# Patient Record
Sex: Female | Born: 1990 | Race: Black or African American | Hispanic: No | Marital: Single | Smoking: Current every day smoker
Health system: Southern US, Community
[De-identification: ages and names within clinical notes are randomized; demographics above are authoritative.]

## PROBLEM LIST (undated history)

## (undated) ENCOUNTER — Inpatient Hospital Stay (HOSPITAL_COMMUNITY): Payer: Self-pay

## (undated) DIAGNOSIS — F32A Depression, unspecified: Secondary | ICD-10-CM

## (undated) DIAGNOSIS — Z789 Other specified health status: Secondary | ICD-10-CM

## (undated) DIAGNOSIS — F1994 Other psychoactive substance use, unspecified with psychoactive substance-induced mood disorder: Secondary | ICD-10-CM

## (undated) DIAGNOSIS — F419 Anxiety disorder, unspecified: Secondary | ICD-10-CM

## (undated) DIAGNOSIS — M419 Scoliosis, unspecified: Secondary | ICD-10-CM

## (undated) DIAGNOSIS — R011 Cardiac murmur, unspecified: Secondary | ICD-10-CM

## (undated) HISTORY — DX: Cardiac murmur, unspecified: R01.1

## (undated) HISTORY — DX: Anxiety disorder, unspecified: F41.9

## (undated) HISTORY — DX: Depression, unspecified: F32.A

## (undated) HISTORY — PX: NO PAST SURGERIES: SHX2092

## (undated) HISTORY — PX: EYE SURGERY: SHX253

---
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
## 2010-05-26 LAB — CBC
Hemoglobin: 11.7 g/dL — ABNORMAL LOW (ref 12.0–15.0)
MCH: 31 pg (ref 26.0–34.0)
MCV: 87.8 fL (ref 78.0–100.0)
RBC: 3.78 MIL/uL — ABNORMAL LOW (ref 3.87–5.11)

## 2010-05-26 LAB — URINALYSIS, ROUTINE W REFLEX MICROSCOPIC
Glucose, UA: NEGATIVE mg/dL
Hgb urine dipstick: NEGATIVE
Ketones, ur: 15 mg/dL — AB
Nitrite: NEGATIVE
Protein, ur: NEGATIVE mg/dL
Specific Gravity, Urine: 1.031 — ABNORMAL HIGH (ref 1.005–1.030)
Urobilinogen, UA: 1 mg/dL (ref 0.0–1.0)
pH: 5.5 (ref 5.0–8.0)

## 2010-05-26 LAB — SEDIMENTATION RATE: Sed Rate: 9 mm/h (ref 0–22)

## 2010-05-26 LAB — BASIC METABOLIC PANEL WITH GFR
BUN: 9 mg/dL (ref 6–23)
CO2: 25 meq/L (ref 19–32)
Calcium: 8.9 mg/dL (ref 8.4–10.5)
Chloride: 109 meq/L (ref 96–112)
Creatinine, Ser: 0.86 mg/dL (ref 0.4–1.2)
GFR calc non Af Amer: >60 mL/min
Glucose, Bld: 102 mg/dL — ABNORMAL HIGH (ref 70–99)
Potassium: 3.7 meq/L (ref 3.5–5.1)
Sodium: 138 meq/L (ref 135–145)

## 2010-05-26 LAB — POCT PREGNANCY, URINE: Preg Test, Ur: NEGATIVE

## 2010-05-26 LAB — DIFFERENTIAL
Eosinophils Absolute: 0.1 10*3/uL (ref 0.0–0.7)
Lymphs Abs: 4.5 10*3/uL — ABNORMAL HIGH (ref 0.7–4.0)
Monocytes Relative: 8 % (ref 3–12)
Neutrophils Relative %: 41 % — ABNORMAL LOW (ref 43–77)

## 2010-07-30 NOTE — Op Note (Signed)
[place]. [facility]  Patient:    Jennifer Campbell, Jennifer Campbell Visit Number: [id number] MRN: [id number]          Service Type: DSU Location: [facility] Attending Physician:  Shara Blazing Dictated by:   Pasty Spillers. Maple Hudson, M.D. Proc. Date: 11/17/00 Admit Date:  11/17/2000                             Operative Report  PREOPERATIVE DIAGNOSIS:  Dissociated vertical deviation, right eye.  POSTOPERATIVE DIAGNOSIS:  Dissociated vertical deviation, right eye.  PROCEDURE:  Superior rectus muscle recession, 12.0 mm OD (hemi-hangback), 7.0 mm OS.  SURGEON:  Pasty Spillers. Maple Hudson, M.D.  ANESTHESIA:  General (laryngeal mask).  COMPLICATIONS:  None.  DESCRIPTION OF PROCEDURE:  After routine preoperative evaluation, including informed consent from the parents, the patient was taken to the operating room, where she was identified by me.  General anesthesia was induced without difficulty after placement of appropriate monitors.  The patient was prepped and draped in standard sterile fashion.  A lid speculum was placed in the right eye.  Through a superotemporal fornix incision through conjunctiva and Tenons fascia, the right superior rectus muscle was engaged on a series of muscle hooks.  The check ligament along the superior surface of the muscle was carefully dissected by blunt and sharp dissection, taking care not to disrupt the superior oblique tendon.  The superior rectus tendon was secured with a double-armed 6-0 Vicryl suture, with a double locking bite at each border of the tendon.  The muscle was disinserted from the globe using Westcott scissors.  Each pole suture was passed in crossed-swords fashion into sclera at a measured distance of 6.0 mm posterior to the insertion.  The muscle was drawn up to this level, and the pole sutures were clamped with a hemostat at a measured distance of 6.0 mm above sclera.  The pole sutures were tied together at this location, and the  muscle was then allowed to hang back 6.0 mm, for a total recession of 12.0 mm.  Conjunctiva was closed with two interrupted 6-0 Vicryl sutures.  The lid speculum was transferred to the left eye, where the left superior rectus muscle was engaged and cleared as described for the right, but was recessed 7.0 mm directly, without hangback.  Conjunctiva was closed with a single interrupted 6-0 Vicryl suture.  Tobradex ointment was placed in each eye.  The patient was awakened without difficulty and taken to the recovery room in stable condition, having suffered no intraoperative or immediate postoperative complication. Dictated by:   Pasty Spillers. Maple Hudson, M.D. Attending Physician:  Shara Blazing DD:  11/17/00 TD:  11/18/00 Job: [id number]

## 2010-11-11 ENCOUNTER — Emergency Department (HOSPITAL_COMMUNITY)
Admission: EM | Admit: 2010-11-11 | Discharge: 2010-11-11 | Disposition: A | Discharge status: Home / Self Care | Payer: Self-pay | Attending: Emergency Medicine | Admitting: Emergency Medicine

## 2010-11-11 ENCOUNTER — Emergency Department (HOSPITAL_COMMUNITY): Payer: Self-pay

## 2010-11-11 DIAGNOSIS — M25579 Pain in unspecified ankle and joints of unspecified foot: Secondary | ICD-10-CM | POA: Insufficient documentation

## 2010-11-11 DIAGNOSIS — S8990XA Unspecified injury of unspecified lower leg, initial encounter: Secondary | ICD-10-CM | POA: Insufficient documentation

## 2010-11-11 DIAGNOSIS — M25473 Effusion, unspecified ankle: Secondary | ICD-10-CM | POA: Insufficient documentation

## 2010-11-11 DIAGNOSIS — M25476 Effusion, unspecified foot: Secondary | ICD-10-CM | POA: Insufficient documentation

## 2010-11-11 DIAGNOSIS — W010XXA Fall on same level from slipping, tripping and stumbling without subsequent striking against object, initial encounter: Secondary | ICD-10-CM | POA: Insufficient documentation

## 2010-11-11 DIAGNOSIS — S93409A Sprain of unspecified ligament of unspecified ankle, initial encounter: Secondary | ICD-10-CM | POA: Insufficient documentation

## 2011-06-08 ENCOUNTER — Encounter: Payer: Self-pay | Admitting: Family Medicine

## 2011-06-08 ENCOUNTER — Ambulatory Visit (INDEPENDENT_AMBULATORY_CARE_PROVIDER_SITE_OTHER): Payer: Self-pay | Admitting: Family Medicine

## 2011-06-08 VITALS — BP 103/69 | HR 86 | Temp 98.0°F | Ht 62.5 in | Wt 155.0 lb

## 2011-06-08 DIAGNOSIS — N938 Other specified abnormal uterine and vaginal bleeding: Secondary | ICD-10-CM

## 2011-06-08 DIAGNOSIS — N926 Irregular menstruation, unspecified: Secondary | ICD-10-CM

## 2011-06-08 DIAGNOSIS — G44229 Chronic tension-type headache, not intractable: Secondary | ICD-10-CM

## 2011-06-08 NOTE — Patient Instructions (Signed)
It was nice to meet you.  I thin your headaches are tension/stress headaches.  It is OK to take two extra strength tylenol or two ibuprofen for a headache (always take ibuprofen with food).    When you get the orange card, please call the office and let me know- I will make a referral to the eye doctor.   Please come in and see me around your 21st birthday and we will do your full physical and pap smear.  You can call anytime for a visit if you want to see me before than.

## 2011-06-09 DIAGNOSIS — G44229 Chronic tension-type headache, not intractable: Secondary | ICD-10-CM | POA: Insufficient documentation

## 2011-06-09 DIAGNOSIS — N938 Other specified abnormal uterine and vaginal bleeding: Secondary | ICD-10-CM | POA: Insufficient documentation

## 2011-06-09 NOTE — Assessment & Plan Note (Addendum)
Feel these are stress related, and reassured patient.  Discussed some stress relieving techniques, and advised it is OK to take occasional medications for headaches.

## 2011-06-09 NOTE — Progress Notes (Signed)
  Subjective:    Patient ID: Jennifer Campbell, female    DOB: 1990-12-23, 20 y.o.   MRN: [id number]  HPI  Jennifer Campbell comes in to establish care. She has not seen a doctor since she was in highschool.  She says she is not currently taking any medications, and that she has no medical problems.   She complains of chronic headaches.  They happen a few times a week, and are dull and throughout her head.  She does not have associated nausea/voming, aura or vision changes.  She is going to school and working and does associate these headaches with her stress.  She does not like to take medication and has only tried one tylenol for the headaches.   Irregular periods- Patient got a depo shot last May for the first time.  She says before that she had had regular periods and had never been on birth control since then.  She says the Depo made her spot a lot, and gave her mood swings, so she did not get the next dose in August.  She says since then her menstrual cycle has been a roller coaster.  She has had weeks where she has a regular period, stops for a few days, and begins bleeding again. She denies having any blood clots, light headedness, or fatigue. She has some mild cramping but not severe pain with her cycle.  She says the past few months her periods have been closer to how they used to be.   Review of Systems Pertinent items in HPI.     Objective:   Physical Exam BP 103/69  Pulse 86  Temp(Src) 98 F (36.7 C) (Oral)  Ht 5' 2.5" (1.588 m)  Wt 155 lb (70.308 kg)  BMI 27.90 kg/m2 General appearance: alert, cooperative and no distress Head: Normocephalic, without obvious abnormality, atraumatic Eyes: conjunctivae/corneas clear. PERRL, EOM's intact. Fundi benign. Nose: Nares normal. Septum midline. Mucosa normal. No drainage or sinus tenderness. CV: RRR Pulm: CTAB Skin: normal color and turgor Pulses: 2+ and symmetric.  Extremities: No edema.  Neuro: CN in tact, strength in upper and lower  extremities full and symmetric, DTR's 2+.       Assessment & Plan:

## 2011-06-09 NOTE — Assessment & Plan Note (Signed)
I feel this may have all been due to the depo shot, and advised patient wait a few more months to see if her periods regulate, as she is not currently interested in OCP's. I advised that she come back if the problem does not continue to improve, as at that point I think the pill would help.

## 2012-03-01 ENCOUNTER — Emergency Department (HOSPITAL_COMMUNITY)
Admission: EM | Admit: 2012-03-01 | Discharge: 2012-03-01 | Disposition: A | Discharge status: Home / Self Care | Payer: Self-pay | Attending: Emergency Medicine | Admitting: Emergency Medicine

## 2012-03-01 ENCOUNTER — Encounter (HOSPITAL_COMMUNITY): Payer: Self-pay | Admitting: *Deleted

## 2012-03-01 DIAGNOSIS — F17200 Nicotine dependence, unspecified, uncomplicated: Secondary | ICD-10-CM | POA: Insufficient documentation

## 2012-03-01 DIAGNOSIS — IMO0002 Reserved for concepts with insufficient information to code with codable children: Secondary | ICD-10-CM | POA: Insufficient documentation

## 2012-03-01 DIAGNOSIS — Z791 Long term (current) use of non-steroidal anti-inflammatories (NSAID): Secondary | ICD-10-CM | POA: Insufficient documentation

## 2012-03-01 DIAGNOSIS — L02419 Cutaneous abscess of limb, unspecified: Secondary | ICD-10-CM

## 2012-03-01 MED ORDER — IBUPROFEN 800 MG PO TABS
800.0000 mg | ORAL_TABLET | Freq: Once | ORAL | Status: AC
  Administered 2012-03-01: 800 mg via ORAL
  Filled 2012-03-01: qty 1

## 2012-03-01 NOTE — ED Notes (Signed)
States has a lump in left armpit. Has been there a while but painful for 2 weeks.

## 2012-03-01 NOTE — ED Provider Notes (Signed)
History     CSN: [id number]  Arrival date & time 03/01/12  0711   First MD Initiated Contact with Patient 03/01/12 0755      Chief Complaint  Patient presents with  . Abscess    (Consider location/radiation/quality/duration/timing/severity/associated sxs/prior treatment) HPI    Patient is up to the emergency department for evaluation of an abscess to her left axilla. She denies having this problem in the past. She says that there has been a mass there for 2 years but in the past 2 weeks it became red and painful. The patient was at work and left early because it was bothering her so much. She denies having fevers, nausea, vomiting, diarrhea or weakness. nad vss  History reviewed. No pertinent past medical history.  History reviewed. No pertinent past surgical history.  Family History  Problem Relation Age of Onset  . Depression Mother   . Asthma Mother   . Asthma Maternal Aunt   . Heart disease Maternal Grandfather   . Depression Paternal Grandmother     History  Substance Use Topics  . Smoking status: Current Some Day Smoker  . Smokeless tobacco: Not on file  . Alcohol Use: No    OB History    Grav Para Term Preterm Abortions TAB SAB Ect Mult Living                  Review of Systems  Review of Systems  Gen: no weight loss, fevers, chills, night sweats  Eyes: no discharge or drainage, no occular pain or visual changes  Abd: no abdominal pain, nausea, vomiting  GU: no dysuria or gross hematuria  MSK:  Abscess to skin Neuro: no headache, no focal neurologic deficits  Skin: no abnormalities Psyche: negative.   Allergies  Review of patient's allergies indicates no known allergies.  Home Medications   Current Outpatient Rx  Name  Route  Sig  Dispense  Refill  . ACETAMINOPHEN 325 MG PO TABS   Oral   Take 650 mg by mouth every 6 (six) hours as needed. Headache or fever           BP 148/82  Pulse 97  Temp 98.3 F (36.8 C) (Oral)  Resp 20  SpO2  100%  LMP 02/14/2012  Physical Exam  Nursing note and vitals reviewed. Constitutional: She appears well-developed and well-nourished. No distress.  HENT:  Head: Normocephalic and atraumatic.  Eyes: Pupils are equal, round, and reactive to light.  Neck: Normal range of motion. Neck supple.  Cardiovascular: Normal rate and regular rhythm.   Pulmonary/Chest: Effort normal.  Abdominal: Soft.  Neurological: She is alert.  Skin: Skin is warm and dry.       ED Course  INCISION AND DRAINAGE Date/Time: 03/01/2012 9:21 AM Performed by: Dorthula Matas Authorized by: Dorthula Matas Consent: Verbal consent obtained. Risks and benefits: risks, benefits and alternatives were discussed Consent given by: patient Type: abscess Location: left axilla. Anesthesia: local infiltration Local anesthetic: lidocaine 2% with epinephrine Patient sedated: no Scalpel size: 11 Needle gauge: 20 Incision type: single straight Complexity: complex Drainage: purulent Drainage amount: moderate Wound treatment: wound left open   (including critical care time)  Labs Reviewed - No data to display No results found.   1. Abscess of axilla       MDM  No need for abx.   Pt has been advised of the symptoms that warrant their return to the ED. Patient has voiced understanding and has agreed to follow-up  with the PCP or specialist.         Dorthula Matas, PA 03/01/12 [phone]

## 2012-03-02 NOTE — ED Provider Notes (Signed)
Medical screening examination/treatment/procedure(s) were performed by non-physician practitioner and as supervising physician I was immediately available for consultation/collaboration.   Joya Gaskins, MD 03/02/12 [phone]

## 2012-04-17 ENCOUNTER — Emergency Department (HOSPITAL_COMMUNITY)
Admission: EM | Admit: 2012-04-17 | Discharge: 2012-04-17 | Disposition: A | Discharge status: Home / Self Care | Payer: Self-pay | Attending: Emergency Medicine | Admitting: Emergency Medicine

## 2012-04-17 ENCOUNTER — Encounter (HOSPITAL_COMMUNITY): Payer: Self-pay | Admitting: *Deleted

## 2012-04-17 DIAGNOSIS — F17200 Nicotine dependence, unspecified, uncomplicated: Secondary | ICD-10-CM | POA: Insufficient documentation

## 2012-04-17 DIAGNOSIS — R3915 Urgency of urination: Secondary | ICD-10-CM | POA: Insufficient documentation

## 2012-04-17 DIAGNOSIS — N390 Urinary tract infection, site not specified: Secondary | ICD-10-CM | POA: Insufficient documentation

## 2012-04-17 DIAGNOSIS — Z3202 Encounter for pregnancy test, result negative: Secondary | ICD-10-CM | POA: Insufficient documentation

## 2012-04-17 DIAGNOSIS — R34 Anuria and oliguria: Secondary | ICD-10-CM | POA: Insufficient documentation

## 2012-04-17 LAB — URINALYSIS, ROUTINE W REFLEX MICROSCOPIC
Nitrite: NEGATIVE
Specific Gravity, Urine: 1.028 (ref 1.005–1.030)
Urobilinogen, UA: 1 mg/dL (ref 0.0–1.0)

## 2012-04-17 LAB — POCT PREGNANCY, URINE: Preg Test, Ur: NEGATIVE

## 2012-04-17 LAB — URINE MICROSCOPIC-ADD ON

## 2012-04-17 MED ORDER — NITROFURANTOIN MONOHYD MACRO 100 MG PO CAPS
100.0000 mg | ORAL_CAPSULE | ORAL | Status: DC
  Filled 2012-04-17: qty 1

## 2012-04-17 MED ORDER — CEPHALEXIN 250 MG PO CAPS
500.0000 mg | ORAL_CAPSULE | Freq: Once | ORAL | Status: AC
  Administered 2012-04-17: 500 mg via ORAL
  Filled 2012-04-17: qty 2

## 2012-04-17 MED ORDER — PHENAZOPYRIDINE HCL 100 MG PO TABS
200.0000 mg | ORAL_TABLET | Freq: Once | ORAL | Status: AC
  Administered 2012-04-17: 200 mg via ORAL
  Filled 2012-04-17: qty 2

## 2012-04-17 MED ORDER — CEPHALEXIN 500 MG PO CAPS: 500.0000 mg | ORAL_CAPSULE | Freq: Two times a day (BID) | ORAL | Status: DC

## 2012-04-17 MED ORDER — PHENAZOPYRIDINE HCL 200 MG PO TABS: 200.0000 mg | ORAL_TABLET | Freq: Three times a day (TID) | ORAL | Status: DC

## 2012-04-17 NOTE — ED Provider Notes (Signed)
History  Scribed for Jaynie Crumble, PA-C/ Joya Gaskins, MD, the patient was seen in room TR09C/TR09C. This chart was scribed by Candelaria Stagers. The patient's care started at 6:07 PM   CSN: [id number]  Arrival date & time 04/17/12  1722   First MD Initiated Contact with Patient 04/17/12 1744      Chief Complaint  Patient presents with  . Urinary Tract Infection     The history is provided by the patient. No language interpreter was used.   Jennifer Campbell is a 22 y.o. female who presents to the Emergency Department complaining of dysuria and decreased urination that started today.  Pt denies vaginal discharge or bleeding, fever, chills, abdominal pain, or back pain.  She is sexually active with one partner.  Nothing seems to make the sx better or worse.   Did not take any medication. No hx of the same.  History reviewed. No pertinent past medical history.  History reviewed. No pertinent past surgical history.  Family History  Problem Relation Age of Onset  . Depression Mother   . Asthma Mother   . Asthma Maternal Aunt   . Heart disease Maternal Grandfather   . Depression Paternal Grandmother     History  Substance Use Topics  . Smoking status: Current Some Day Smoker -- 0.1 packs/day    Types: Cigarettes  . Smokeless tobacco: Not on file  . Alcohol Use: No    OB History    Grav Para Term Preterm Abortions TAB SAB Ect Mult Living                  Review of Systems  Constitutional: Negative for fever and chills.  Respiratory: Negative for shortness of breath.   Gastrointestinal: Negative for nausea and vomiting.  Genitourinary: Positive for dysuria, urgency and decreased urine volume. Negative for vaginal bleeding and vaginal discharge.  Musculoskeletal: Negative for back pain.  Skin: Negative for rash.  Neurological: Negative for weakness.  All other systems reviewed and are negative.    Allergies  Review of patient's allergies indicates no  known allergies.  Home Medications  No current outpatient prescriptions on file.  BP 147/82  Pulse 66  Temp 98.4 F (36.9 C) (Oral)  Resp 16  SpO2 100%  LMP 03/03/2012  Physical Exam  Nursing note and vitals reviewed. Constitutional: She is oriented to person, place, and time. She appears well-developed and well-nourished. No distress.  HENT:  Head: Normocephalic and atraumatic.  Eyes: EOM are normal.  Neck: Neck supple. No tracheal deviation present.  Cardiovascular: Normal rate and regular rhythm.   Pulmonary/Chest: Effort normal. No respiratory distress. She has no wheezes. She has no rales.  Abdominal: Soft. Bowel sounds are normal. There is no tenderness.  Musculoskeletal: Normal range of motion.  Neurological: She is alert and oriented to person, place, and time.  Skin: Skin is warm and dry.  Psychiatric: She has a normal mood and affect. Her behavior is normal.    ED Course  Procedures   DIAGNOSTIC STUDIES: Oxygen Saturation is 100% on room air, normal by my interpretation.    COORDINATION OF CARE:  5:40 PM Ordered: Urinalysis, Routine w reflex microscopic; POCT Pregnancy, Urine  6:39 PM Will discharge pt with antibiotics.  Pt understands and agrees.  Discharged with cephALEXin (KEFLEX) capsule 500 mg; phenazopyridine (PYRIDIUM) tablet 200 mg Labs Reviewed  URINALYSIS, ROUTINE W REFLEX MICROSCOPIC - Abnormal; Notable for the following:    APPearance HAZY (*)     Hgb  urine dipstick LARGE (*)     Bilirubin Urine SMALL (*)     Ketones, ur 40 (*)     Protein, ur 100 (*)     Leukocytes, UA MODERATE (*)     All other components within normal limits  URINE MICROSCOPIC-ADD ON - Abnormal; Notable for the following:    Squamous Epithelial / LPF MANY (*)     All other components within normal limits  POCT PREGNANCY, URINE   No results found.   1. UTI (lower urinary tract infection)       MDM  PT with urinary symptoms. Afebrile. Non toxic. UA infected but  contaminated. Cultures sent. Due to symptoms, will treat. Started on keflex. Pyridium for symptoms. Denies vaginal symptoms. Does not want pelvic exam. Will follow up with pcp.     I personally performed the services described in this documentation, which was scribed in my presence. The recorded information has been reviewed and is accurate.      Lottie Mussel, PA 04/18/12 [phone]

## 2012-04-17 NOTE — ED Notes (Signed)
Pt c/o difficulty urinating and pain on urination since last night.  Denies flank pain, abd pain or nausea.  States she "peed on myself in my sleep".  Denies vaginal discharge.

## 2012-04-17 NOTE — ED Notes (Signed)
Pt reports dysuria starting this am. States that she woke up this am and had urinated on herself last night. Reports urinary urgency, frequency, and pain on urination. Denies nausea, vomiting, or abdominal pain.

## 2012-04-18 LAB — URINE CULTURE: Colony Count: NO GROWTH

## 2012-04-19 NOTE — ED Provider Notes (Signed)
Medical screening examination/treatment/procedure(s) were performed by non-physician practitioner and as supervising physician I was immediately available for consultation/collaboration.   Kristy Catoe W Makinsley Schiavi, MD 04/19/12 2222 

## 2012-07-20 ENCOUNTER — Emergency Department (HOSPITAL_COMMUNITY)
Admission: EM | Admit: 2012-07-20 | Discharge: 2012-07-20 | Disposition: A | Discharge status: Home / Self Care | Payer: No Typology Code available for payment source | Attending: Emergency Medicine | Admitting: Emergency Medicine

## 2012-07-20 ENCOUNTER — Encounter (HOSPITAL_COMMUNITY): Payer: Self-pay | Admitting: Emergency Medicine

## 2012-07-20 DIAGNOSIS — Y9389 Activity, other specified: Secondary | ICD-10-CM | POA: Insufficient documentation

## 2012-07-20 DIAGNOSIS — S6990XA Unspecified injury of unspecified wrist, hand and finger(s), initial encounter: Secondary | ICD-10-CM | POA: Insufficient documentation

## 2012-07-20 DIAGNOSIS — Y92410 Unspecified street and highway as the place of occurrence of the external cause: Secondary | ICD-10-CM | POA: Insufficient documentation

## 2012-07-20 DIAGNOSIS — S59919A Unspecified injury of unspecified forearm, initial encounter: Secondary | ICD-10-CM | POA: Insufficient documentation

## 2012-07-20 DIAGNOSIS — M7918 Myalgia, other site: Secondary | ICD-10-CM

## 2012-07-20 DIAGNOSIS — S59909A Unspecified injury of unspecified elbow, initial encounter: Secondary | ICD-10-CM | POA: Insufficient documentation

## 2012-07-20 DIAGNOSIS — S8990XA Unspecified injury of unspecified lower leg, initial encounter: Secondary | ICD-10-CM | POA: Insufficient documentation

## 2012-07-20 DIAGNOSIS — S99919A Unspecified injury of unspecified ankle, initial encounter: Secondary | ICD-10-CM | POA: Insufficient documentation

## 2012-07-20 DIAGNOSIS — F17200 Nicotine dependence, unspecified, uncomplicated: Secondary | ICD-10-CM | POA: Insufficient documentation

## 2012-07-20 MED ORDER — IBUPROFEN 800 MG PO TABS
800.0000 mg | ORAL_TABLET | Freq: Once | ORAL | Status: AC
  Administered 2012-07-20: 800 mg via ORAL
  Filled 2012-07-20: qty 1

## 2012-07-20 MED ORDER — HYDROCODONE-ACETAMINOPHEN 5-325 MG PO TABS: 1.0000 | ORAL_TABLET | Freq: Four times a day (QID) | ORAL | Status: DC | PRN

## 2012-07-20 MED ORDER — DIAZEPAM 5 MG PO TABS: ORAL_TABLET | ORAL | Status: DC
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
## 2012-07-20 NOTE — ED Notes (Signed)
Pt asked to change to gown.  RN at the bedside

## 2012-07-21 NOTE — ED Provider Notes (Signed)
Medical screening examination/treatment/procedure(s) were performed by non-physician practitioner and as supervising physician I was immediately available for consultation/collaboration.   Carleene Cooper III, MD 07/21/12 1228

## 2013-02-12 ENCOUNTER — Encounter (HOSPITAL_COMMUNITY): Payer: Self-pay | Admitting: Emergency Medicine

## 2013-02-12 ENCOUNTER — Emergency Department (HOSPITAL_COMMUNITY)
Admission: EM | Admit: 2013-02-12 | Discharge: 2013-02-12 | Disposition: A | Discharge status: Home / Self Care | Payer: No Typology Code available for payment source | Attending: Emergency Medicine | Admitting: Emergency Medicine

## 2013-02-12 DIAGNOSIS — R21 Rash and other nonspecific skin eruption: Secondary | ICD-10-CM | POA: Insufficient documentation

## 2013-02-12 DIAGNOSIS — Z79899 Other long term (current) drug therapy: Secondary | ICD-10-CM | POA: Insufficient documentation

## 2013-02-12 DIAGNOSIS — F17200 Nicotine dependence, unspecified, uncomplicated: Secondary | ICD-10-CM | POA: Insufficient documentation

## 2013-02-12 MED ORDER — PREDNISONE 20 MG PO TABS: 60.0000 mg | ORAL_TABLET | Freq: Every day | ORAL | Status: DC

## 2013-02-12 NOTE — ED Provider Notes (Signed)
CSN: [id number]     Arrival date & time 02/12/13  1927 History  This chart was scribed for non-physician practitioner, Santiago Glad, PA-C,working with Glynn Octave, MD, by Karle Plumber, ED Scribe.  This patient was seen in room TR10C/TR10C and the patient's care was started at 9:06 PM.  Chief Complaint  Patient presents with  . Rash   The history is provided by the patient. No language interpreter was used.   HPI Comments:  Jennifer Campbell is a 22 y.o. female who presents to the Emergency Department complaining of a new itchy rash that started yesterday morning. She reports the rash on her face, neck, back of neck, right hand, and left leg. Pt states she took a Benadryl yesterday with no relief. She states she has applied Triamcinolone Cream PTA with no relief. She states her room mate or anybody else around her has the rash. Pt reports using a new soap the night before waking up with the rash. Pt denies fever, chills, nausea, vomiting, or SOB. Pt expresses concern of DM secondary to family history and requests a referral for a PCP.  History reviewed. No pertinent past medical history. History reviewed. No pertinent past surgical history. Family History  Problem Relation Age of Onset  . Depression Mother   . Asthma Mother   . Asthma Maternal Aunt   . Heart disease Maternal Grandfather   . Depression Paternal Grandmother    History  Substance Use Topics  . Smoking status: Current Some Day Smoker -- 0.15 packs/day    Types: Cigarettes  . Smokeless tobacco: Not on file  . Alcohol Use: No   OB History   Grav Para Term Preterm Abortions TAB SAB Ect Mult Living                 Review of Systems  Constitutional: Negative for fever, chills and unexpected weight change.  Respiratory: Negative for shortness of breath.   Gastrointestinal: Negative for nausea and vomiting.  All other systems reviewed and are negative.    Allergies  Review of patient's allergies indicates  no known allergies.  Home Medications   Current Outpatient Rx  Name  Route  Sig  Dispense  Refill  . diazepam (VALIUM) 5 MG tablet      Take one pill at night time for sleep as a muscle relaxant.   6 tablet   0   . HYDROcodone-acetaminophen (NORCO/VICODIN) 5-325 MG per tablet   Oral   Take 1 tablet by mouth every 6 (six) hours as needed for pain.   6 tablet   0    Triage Vitals: BP 138/71  Pulse 58  Temp(Src) 98.1 F (36.7 C) (Oral)  Resp 14  Ht 5\' 2"  (1.575 m)  Wt 125 lb (56.7 kg)  BMI 22.86 kg/m2  SpO2 100%  LMP 01/28/2013 Physical Exam  Nursing note and vitals reviewed. Constitutional: She is oriented to person, place, and time. She appears well-developed and well-nourished.  HENT:  Head: Normocephalic and atraumatic.  Mouth/Throat: Oropharynx is clear and moist.  Neck: Neck supple.  Pulmonary/Chest: Effort normal.  Neurological: She is alert and oriented to person, place, and time. No cranial nerve deficit.  Skin: Skin is dry. Rash noted. There is erythema.  Erythematous base with centrally located papule on distal left forearm of posterior aspect. No drainage noted.  2 papules on medial aspect of left lower leg. No drainage noted. Several periorbital small papules on face. No drainage noted.   Psychiatric: She has  a normal mood and affect. Her behavior is normal.    ED Course  Procedures (including critical care time) DIAGNOSTIC STUDIES: Oxygen Saturation is 100% on RA, normal by my interpretation.   COORDINATION OF CARE: 9:14 PM- Advised pt not to use the new soap anymore. Will prescribe Prednisone. Advised pt to continue taking the Benadryl and to try OTC Pepcid. Informed pt that she can apply Hydrocortisone Cream, but use caution in applying it to her face. Will give pt referral for PCP. Pt verbalizes understanding and agrees to plan.  Medications - No data to display  Labs Review Labs Reviewed - No data to display Imaging Review No results  found.  EKG Interpretation   None       MDM  No diagnosis found. Patient presenting with a rash that developed after using a new soap.  Suspect contact dermatitis.  No swelling of the tongue, lips, or throat.  Patient instructed to stop using the soap and to take Benadryl for itching.  Patient stable for discharge.  I personally performed the services described in this documentation, which was scribed in my presence. The recorded information has been reviewed and is accurate.    Santiago Glad, PA-C 02/14/13 1206

## 2013-02-12 NOTE — ED Notes (Signed)
Pt. reports itchy facial rash onset yesterday unrelieved by OTC Benadryl , respirations unlabored / airway intact .

## 2013-02-14 NOTE — ED Provider Notes (Signed)
Medical screening examination/treatment/procedure(s) were performed by non-physician practitioner and as supervising physician I was immediately available for consultation/collaboration.  EKG Interpretation   None         Glynn Octave, MD 02/14/13 [phone]

## 2013-02-22 ENCOUNTER — Encounter: Payer: Self-pay | Admitting: Family Medicine

## 2013-05-24 ENCOUNTER — Encounter (HOSPITAL_COMMUNITY): Payer: Self-pay | Admitting: Emergency Medicine

## 2013-05-24 ENCOUNTER — Emergency Department (HOSPITAL_COMMUNITY)
Admission: EM | Admit: 2013-05-24 | Discharge: 2013-05-24 | Disposition: A | Discharge status: Home / Self Care | Payer: Self-pay | Attending: Emergency Medicine | Admitting: Emergency Medicine

## 2013-05-24 ENCOUNTER — Emergency Department (HOSPITAL_COMMUNITY): Payer: Self-pay

## 2013-05-24 DIAGNOSIS — F17200 Nicotine dependence, unspecified, uncomplicated: Secondary | ICD-10-CM | POA: Insufficient documentation

## 2013-05-24 DIAGNOSIS — M25512 Pain in left shoulder: Secondary | ICD-10-CM

## 2013-05-24 DIAGNOSIS — Z87828 Personal history of other (healed) physical injury and trauma: Secondary | ICD-10-CM | POA: Insufficient documentation

## 2013-05-24 DIAGNOSIS — IMO0002 Reserved for concepts with insufficient information to code with codable children: Secondary | ICD-10-CM | POA: Insufficient documentation

## 2013-05-24 DIAGNOSIS — M25519 Pain in unspecified shoulder: Secondary | ICD-10-CM | POA: Insufficient documentation

## 2013-05-24 MED ORDER — NAPROXEN 500 MG PO TABS: 500.0000 mg | ORAL_TABLET | Freq: Two times a day (BID) | ORAL | Status: DC

## 2013-05-24 NOTE — Discharge Instructions (Signed)
Your imaging was negative for fracture or dislocation. You will need to see a shoulder specialist for your shoulder. Shoulder Pain The shoulder is the joint that connects your arms to your body. The bones that form the shoulder joint include the upper arm bone (humerus), the shoulder blade (scapula), and the collarbone (clavicle). The top of the humerus is shaped like a ball and fits into a rather flat socket on the scapula (glenoid cavity). A combination of muscles and strong, fibrous tissues that connect muscles to bones (tendons) support your shoulder joint and hold the ball in the socket. Small, fluid-filled sacs (bursae) are located in different areas of the joint. They act as cushions between the bones and the overlying soft tissues and help reduce friction between the gliding tendons and the bone as you move your arm. Your shoulder joint allows a wide range of motion in your arm. This range of motion allows you to do things like scratch your back or throw a ball. However, this range of motion also makes your shoulder more prone to pain from overuse and injury. Causes of shoulder pain can originate from both injury and overuse and usually can be grouped in the following four categories:  Redness, swelling, and pain (inflammation) of the tendon (tendinitis) or the bursae (bursitis).  Instability, such as a dislocation of the joint.  Inflammation of the joint (arthritis).  Broken bone (fracture). HOME CARE INSTRUCTIONS   Apply ice to the sore area.  Put ice in a plastic bag.  Place a towel between your skin and the bag.  Leave the ice on for 15-20 minutes, 03-04 times per day for the first 2 days.  Stop using cold packs if they do not help with the pain.  If you have a shoulder sling or immobilizer, wear it as long as your caregiver instructs. Only remove it to shower or bathe. Move your arm as little as possible, but keep your hand moving to prevent swelling.  Squeeze a soft ball or  foam pad as much as possible to help prevent swelling.  Only take over-the-counter or prescription medicines for pain, discomfort, or fever as directed by your caregiver. SEEK MEDICAL CARE IF:   Your shoulder pain increases, or new pain develops in your arm, hand, or fingers.  Your hand or fingers become cold and numb.  Your pain is not relieved with medicines. SEEK IMMEDIATE MEDICAL CARE IF:   Your arm, hand, or fingers are numb or tingling.  Your arm, hand, or fingers are significantly swollen or turn white or blue. MAKE SURE YOU:   Understand these instructions.  Will watch your condition.  Will get help right away if you are not doing well or get worse. Document Released: 12/08/2004 Document Revised: 11/23/2011 Document Reviewed: 02/12/2011 [facility] Patient Information 2014 [place], [place].   Naproxen and naproxen sodium oral immediate-release tablets What is this medicine? NAPROXEN (na PROX en) is a non-steroidal anti-inflammatory drug (NSAID). It is used to reduce swelling and to treat pain. This medicine may be used for dental pain, headache, or painful monthly periods. It is also used for painful joint and muscular problems such as arthritis, tendinitis, bursitis, and gout. This medicine may be used for other purposes; ask your health care provider or pharmacist if you have questions. COMMON BRAND NAME(S): Aflaxen, Aleve Arthritis, Aleve, All Day Relief, Anaprox DS, Anaprox, Naprosyn What should I tell my health care provider before I take this medicine? They need to know if you have any of  these conditions: -asthma -cigarette smoker -drink more than 3 alcohol containing drinks a day -heart disease or circulation problems such as heart failure or leg edema (fluid retention) -high blood pressure -kidney disease -liver disease -stomach bleeding or ulcers -an unusual or allergic reaction to naproxen, aspirin, other NSAIDs, other medicines, foods, dyes, or  preservatives -pregnant or trying to get pregnant -breast-feeding How should I use this medicine? Take this medicine by mouth with a glass of water. Follow the directions on the prescription label. Take it with food if your stomach gets upset. Try to not lie down for at least 10 minutes after you take it. Take your medicine at regular intervals. Do not take your medicine more often than directed. Long-term, continuous use may increase the risk of heart attack or stroke. A special MedGuide will be given to you by the pharmacist with each prescription and refill. Be sure to read this information carefully each time. Talk to your pediatrician regarding the use of this medicine in children. Special care may be needed. Overdosage: If you think you have taken too much of this medicine contact a poison control center or emergency room at once. NOTE: This medicine is only for you. Do not share this medicine with others. What if I miss a dose? If you miss a dose, take it as soon as you can. If it is almost time for your next dose, take only that dose. Do not take double or extra doses. What may interact with this medicine? -alcohol -aspirin -cidofovir -diuretics -lithium -methotrexate -other drugs for inflammation like ketorolac or prednisone -pemetrexed -probenecid -warfarin This list may not describe all possible interactions. Give your health care provider a list of all the medicines, herbs, non-prescription drugs, or dietary supplements you use. Also tell them if you smoke, drink alcohol, or use illegal drugs. Some items may interact with your medicine. What should I watch for while using this medicine? Tell your doctor or health care professional if your pain does not get better. Talk to your doctor before taking another medicine for pain. Do not treat yourself. This medicine does not prevent heart attack or stroke. In fact, this medicine may increase the chance of a heart attack or stroke. The  chance may increase with longer use of this medicine and in people who have heart disease. If you take aspirin to prevent heart attack or stroke, talk with your doctor or health care professional. Do not take other medicines that contain aspirin, ibuprofen, or naproxen with this medicine. Side effects such as stomach upset, nausea, or ulcers may be more likely to occur. Many medicines available without a prescription should not be taken with this medicine. This medicine can cause ulcers and bleeding in the stomach and intestines at any time during treatment. Do not smoke cigarettes or drink alcohol. These increase irritation to your stomach and can make it more susceptible to damage from this medicine. Ulcers and bleeding can happen without warning symptoms and can cause death. You may get drowsy or dizzy. Do not drive, use machinery, or do anything that needs mental alertness until you know how this medicine affects you. Do not stand or sit up quickly, especially if you are an older patient. This reduces the risk of dizzy or fainting spells. This medicine can cause you to bleed more easily. Try to avoid damage to your teeth and gums when you brush or floss your teeth. What side effects may I notice from receiving this medicine? Side effects that you  should report to your doctor or health care professional as soon as possible: -black or bloody stools, blood in the urine or vomit -blurred vision -chest pain -difficulty breathing or wheezing -nausea or vomiting -severe stomach pain -skin rash, skin redness, blistering or peeling skin, hives, or itching -slurred speech or weakness on one side of the body -swelling of eyelids, throat, lips -unexplained weight gain or swelling -unusually weak or tired -yellowing of eyes or skin Side effects that usually do not require medical attention (report to your doctor or health care professional if they continue or are  bothersome): -constipation -headache -heartburn This list may not describe all possible side effects. Call your doctor for medical advice about side effects. You may report side effects to FDA at 1-800-FDA-1088. Where should I keep my medicine? Keep out of the reach of children. Store at room temperature between 15 and 30 degrees C (59 and 86 degrees F). Keep container tightly closed. Throw away any unused medicine after the expiration date. NOTE: This sheet is a summary. It may not cover all possible information. If you have questions about this medicine, talk to your doctor, pharmacist, or health care provider.  2014, Elsevier/Gold Standard. (2009-03-02 20:10:16)
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
## 2013-05-24 NOTE — ED Notes (Signed)
Pt states her L shoulder "locks up" when she reaches up. States symptoms for 2 months. Pt states this is happening more often now. Pt denies any injury to shoulder. Pt states her shoulder is just sore now.

## 2013-05-25 NOTE — ED Provider Notes (Signed)
Medical screening examination/treatment/procedure(s) were performed by non-physician practitioner and as supervising physician I was immediately available for consultation/collaboration.   EKG Interpretation None       Verona Hartshorn, MD 05/25/13 0000 

## 2013-08-21 ENCOUNTER — Emergency Department (HOSPITAL_COMMUNITY)
Admission: EM | Admit: 2013-08-21 | Discharge: 2013-08-21 | Disposition: A | Discharge status: Home / Self Care | Payer: No Typology Code available for payment source | Attending: Emergency Medicine | Admitting: Emergency Medicine

## 2013-08-21 ENCOUNTER — Encounter (HOSPITAL_COMMUNITY): Payer: Self-pay | Admitting: Emergency Medicine

## 2013-08-21 DIAGNOSIS — Z791 Long term (current) use of non-steroidal anti-inflammatories (NSAID): Secondary | ICD-10-CM | POA: Insufficient documentation

## 2013-08-21 DIAGNOSIS — IMO0001 Reserved for inherently not codable concepts without codable children: Secondary | ICD-10-CM | POA: Insufficient documentation

## 2013-08-21 DIAGNOSIS — W57XXXA Bitten or stung by nonvenomous insect and other nonvenomous arthropods, initial encounter: Secondary | ICD-10-CM

## 2013-08-21 DIAGNOSIS — Y939 Activity, unspecified: Secondary | ICD-10-CM | POA: Insufficient documentation

## 2013-08-21 DIAGNOSIS — IMO0002 Reserved for concepts with insufficient information to code with codable children: Secondary | ICD-10-CM | POA: Insufficient documentation

## 2013-08-21 DIAGNOSIS — F17200 Nicotine dependence, unspecified, uncomplicated: Secondary | ICD-10-CM | POA: Insufficient documentation

## 2013-08-21 DIAGNOSIS — Y929 Unspecified place or not applicable: Secondary | ICD-10-CM | POA: Insufficient documentation

## 2013-08-21 MED ORDER — PERMETHRIN 5 % EX CREA: TOPICAL_CREAM | CUTANEOUS | Status: DC

## 2013-08-21 MED ORDER — FAMOTIDINE 20 MG PO TABS: 20.0000 mg | ORAL_TABLET | Freq: Two times a day (BID) | ORAL | Status: DC

## 2013-08-21 MED ORDER — DIPHENHYDRAMINE HCL 25 MG PO TABS: 25.0000 mg | ORAL_TABLET | Freq: Four times a day (QID) | ORAL | Status: DC | PRN

## 2013-08-21 MED ORDER — HYDROCORTISONE 1 % EX CREA: 1.0000 "application " | TOPICAL_CREAM | Freq: Two times a day (BID) | CUTANEOUS | Status: DC

## 2013-08-21 NOTE — ED Notes (Signed)
Pt A+ox4, reports rash to arms/leg x1 week, denies pain, +itching.  Red, flat areas noted.  Pt denies other complaints.  Skin otherwise PWD.  Ambulatory with steady gait.  NAD.

## 2013-08-21 NOTE — ED Provider Notes (Signed)
CSN: [id number]     Arrival date & time 08/21/13  1918 History  This chart was scribed for Antonietta Breach, PA, working with Jasper Riling. Alvino Chapel, MD by Steva Colder, ED Scribe. The patient was seen in room WTR8/WTR8 at 8:09 PM.    Chief Complaint  Patient presents with  . Rash   The history is provided by the patient. No language interpreter was used.   HPI Comments: Jennifer Campbell is a 23 y.o. female who presents to the Emergency Department complaining of a rash onset 2 weeks ago. Pt states that the rash started on her left forearm and has spread to both arms, back, and both legs. She states that the rash started out red and itchy. Pt denies any associated symptoms. Pt has not taking any medications for the rash. Pt states that she has not used any new detergents, and lotions. No recent antibiotic use. Pt denies fever, vomiting, stomach pain, swelling of her mouth tongue and throat, no problems swallowing, SOB, and wheezing. Pt states that she has been sleeping in a new bed as of lately.   History reviewed. No pertinent past medical history. History reviewed. No pertinent past surgical history. Family History  Problem Relation Age of Onset  . Depression Mother   . Asthma Mother   . Asthma Maternal Aunt   . Heart disease Maternal Grandfather   . Depression Paternal Grandmother    History  Substance Use Topics  . Smoking status: Current Some Day Smoker -- 0.15 packs/day    Types: Cigarettes  . Smokeless tobacco: Not on file  . Alcohol Use: No   OB History   Grav Para Term Preterm Abortions TAB SAB Ect Mult Living                 Review of Systems  Constitutional: Negative for fever.  HENT: Negative for trouble swallowing.   Respiratory: Negative for shortness of breath and wheezing.   Gastrointestinal: Negative for vomiting and abdominal pain.  Skin: Positive for rash.  All other systems reviewed and are negative.    Allergies  Review of patient's allergies indicates no  known allergies.  Home Medications   Prior to Admission medications   Medication Sig Start Date End Date Taking? Authorizing Provider  diphenhydrAMINE (BENADRYL) 25 MG tablet Take 50 mg by mouth once.    Historical Provider, MD  diphenhydrAMINE (BENADRYL) 25 MG tablet Take 1 tablet (25 mg total) by mouth every 6 (six) hours as needed for itching (Rash). 08/21/13   Antonietta Breach, PA-C  famotidine (PEPCID) 20 MG tablet Take 1 tablet (20 mg total) by mouth 2 (two) times daily. 08/21/13   Antonietta Breach, PA-C  hydrocortisone cream 1 % Apply 1 application topically 2 (two) times daily. Do not apply to face 08/21/13   Antonietta Breach, PA-C  naproxen (NAPROSYN) 500 MG tablet Take 1 tablet (500 mg total) by mouth 2 (two) times daily. 05/24/13   Margarita Mail, PA-C  permethrin (ELIMITE) 5 % cream Apply to entire body other than face - let sit for 14 hours then wash off, may repeat in 1 week if still having symptoms 08/21/13   Antonietta Breach, PA-C  predniSONE (DELTASONE) 20 MG tablet Take 3 tablets (60 mg total) by mouth daily. 02/12/13   Heather Laisure, PA-C   BP 138/78  Pulse 80  Temp(Src) 98.9 F (37.2 C) (Oral)  Resp 16  SpO2 99%  LMP 08/19/2013  Physical Exam  Nursing note and vitals reviewed. Constitutional: She  is oriented to person, place, and time. She appears well-developed and well-nourished. No distress.  Nontoxic/nonseptic appearing  HENT:  Head: Normocephalic and atraumatic.  Eyes: Conjunctivae and EOM are normal. No scleral icterus.  Neck: Normal range of motion. Neck supple.  No stridor  Cardiovascular: Normal rate, regular rhythm, normal heart sounds and intact distal pulses.   Pulmonary/Chest: Effort normal and breath sounds normal. No stridor. No respiratory distress. She has no wheezes. She has no rales.  No wheezes or rales. Chest expansion is symmetric.  Musculoskeletal: Normal range of motion.  Neurological: She is alert and oriented to person, place, and time.  Skin: Skin is warm  and dry. Rash noted. She is not diaphoretic. No erythema. No pallor.  Patient with scattered erythematous macules that are pruritic and blanching. No red streaking. No induration. No skin peeling, weeping, or drainage.  Psychiatric: She has a normal mood and affect. Her behavior is normal.    ED Course  Procedures (including critical care time) DIAGNOSTIC STUDIES: Oxygen Saturation is 99% on room air, normal by my interpretation.    COORDINATION OF CARE: 8:14 PM-Discussed treatment plan which includes topical hydrocortisol with pt at bedside and pt agreed to plan.   Labs Review Labs Reviewed - No data to display  Imaging Review No results found.   EKG Interpretation None      MDM   Final diagnoses:  Bug bites    Uncomplicated bug bites. Patient states that symptoms have been ongoing x2 weeks. Patient denies any new detergents or lotions. No recent antibiotic use. She does state that she has been sleeping at her cousin's house which is new for her. Rash is non-concerning and consistent with bug bites. No stridor. Patient tolerating secretions without difficulty. No evidence of SJS, erythema multiforme major, or erythema multiforme minor. Will cover her with Elimite and treat symptomatically with Benadryl, Pepcid, and hydrocortisone. Return precautions provided and patient agreeable to plan with no unaddressed concerns.  I personally performed the services described in this documentation, which was scribed in my presence. The recorded information has been reviewed and is accurate.   Filed Vitals:   08/21/13 1944  BP: 138/78  Pulse: 80  Temp: 98.9 F (37.2 C)  TempSrc: Oral  Resp: 16  SpO2: 99%      Antonietta Breach, PA-C 08/21/13 2111

## 2013-08-21 NOTE — Discharge Instructions (Signed)
Insect Bite  Mosquitoes, flies, fleas, bedbugs, and many other insects can bite. Insect bites are different from insect stings. A sting is when venom is injected into the skin. Some insect bites can transmit infectious diseases.  SYMPTOMS   Insect bites usually turn red, swell, and itch for 2 to 4 days. They often go away on their own.  TREATMENT   Your caregiver may prescribe antibiotic medicines if a bacterial infection develops in the bite.  HOME CARE INSTRUCTIONS   Do not scratch the bite area.   Keep the bite area clean and dry. Wash the bite area thoroughly with soap and water.   Put ice or cool compresses on the bite area.   Put ice in a plastic bag.   Place a towel between your skin and the bag.   Leave the ice on for 20 minutes, 4 times a day for the first 2 to 3 days, or as directed.   You may apply a baking soda paste, cortisone cream, or calamine lotion to the bite area as directed by your caregiver. This can help reduce itching and swelling.   Only take over-the-counter or prescription medicines as directed by your caregiver.   If you are given antibiotics, take them as directed. Finish them even if you start to feel better.  You may need a tetanus shot if:   You cannot remember when you had your last tetanus shot.   You have never had a tetanus shot.   The injury broke your skin.  If you get a tetanus shot, your arm may swell, get red, and feel warm to the touch. This is common and not a problem. If you need a tetanus shot and you choose not to have one, there is a rare chance of getting tetanus. Sickness from tetanus can be serious.  SEEK IMMEDIATE MEDICAL CARE IF:    You have increased pain, redness, or swelling in the bite area.   You see a red line on the skin coming from the bite.   You have a fever.   You have joint pain.   You have a headache or neck pain.   You have unusual weakness.   You have a rash.   You have chest pain or shortness of breath.    You have abdominal pain, nausea, or vomiting.   You feel unusually tired or sleepy.  MAKE SURE YOU:    Understand these instructions.   Will watch your condition.   Will get help right away if you are not doing well or get worse.  Document Released: 04/07/2004 Document Revised: 05/23/2011 Document Reviewed: 09/29/2010  ExitCare Patient Information 2014 [employer].

## 2013-08-24 NOTE — ED Provider Notes (Signed)
Medical screening examination/treatment/procedure(s) were performed by non-physician practitioner and as supervising physician I was immediately available for consultation/collaboration.   EKG Interpretation None       Lawana Hartzell R. Jayel Scaduto, MD 08/24/13 0708 

## 2014-01-21 ENCOUNTER — Encounter (HOSPITAL_COMMUNITY): Payer: Self-pay | Admitting: Emergency Medicine

## 2014-01-21 DIAGNOSIS — Z3202 Encounter for pregnancy test, result negative: Secondary | ICD-10-CM | POA: Insufficient documentation

## 2014-01-21 DIAGNOSIS — N939 Abnormal uterine and vaginal bleeding, unspecified: Secondary | ICD-10-CM | POA: Insufficient documentation

## 2014-01-21 LAB — URINALYSIS, ROUTINE W REFLEX MICROSCOPIC
BILIRUBIN URINE: NEGATIVE
GLUCOSE, UA: NEGATIVE mg/dL
Ketones, ur: NEGATIVE mg/dL
Leukocytes, UA: NEGATIVE
Nitrite: NEGATIVE
PH: 6 (ref 5.0–8.0)
Protein, ur: NEGATIVE mg/dL
SPECIFIC GRAVITY, URINE: 1.005 (ref 1.005–1.030)
UROBILINOGEN UA: 0.2 mg/dL (ref 0.0–1.0)

## 2014-01-21 LAB — PREGNANCY, URINE: Preg Test, Ur: NEGATIVE

## 2014-01-21 LAB — URINE MICROSCOPIC-ADD ON

## 2014-01-21 NOTE — ED Notes (Signed)
Pt st's she had her period on 11/1 that lasted 1 week.  Pt st's she started bleeding again today with abd cramping

## 2014-01-22 ENCOUNTER — Inpatient Hospital Stay (HOSPITAL_COMMUNITY)
Admission: AD | Admit: 2014-01-22 | Discharge: 2014-01-22 | Discharge status: Against Medical Advice | Payer: Self-pay | Source: Ambulatory Visit | Attending: Obstetrics & Gynecology | Admitting: Obstetrics & Gynecology

## 2014-01-22 ENCOUNTER — Emergency Department (HOSPITAL_COMMUNITY)
Admission: EM | Admit: 2014-01-22 | Discharge: 2014-01-22 | Discharge status: Against Medical Advice | Payer: No Typology Code available for payment source | Attending: Emergency Medicine | Admitting: Emergency Medicine

## 2014-01-22 DIAGNOSIS — Z5320 Procedure and treatment not carried out because of patient's decision for unspecified reasons: Secondary | ICD-10-CM | POA: Insufficient documentation

## 2014-01-22 NOTE — MAU Note (Signed)
Not in lobby

## 2014-01-22 NOTE — MAU Note (Signed)
Pt reports her LMP started on 11/01, stopped on 11/07 and then she started bleeding again yesterday. Mild cramping at times.

## 2014-01-22 NOTE — ED Notes (Signed)
The patient advised registration that she did not want to wait any longer.

## 2014-02-15 ENCOUNTER — Inpatient Hospital Stay (HOSPITAL_COMMUNITY)
Admission: AD | Admit: 2014-02-15 | Discharge: 2014-02-15 | Disposition: A | Discharge status: Home / Self Care | Payer: Self-pay | Source: Ambulatory Visit | Attending: Family Medicine | Admitting: Family Medicine

## 2014-02-15 ENCOUNTER — Encounter (HOSPITAL_COMMUNITY): Payer: Self-pay | Admitting: *Deleted

## 2014-02-15 DIAGNOSIS — B9689 Other specified bacterial agents as the cause of diseases classified elsewhere: Secondary | ICD-10-CM | POA: Insufficient documentation

## 2014-02-15 DIAGNOSIS — A499 Bacterial infection, unspecified: Secondary | ICD-10-CM

## 2014-02-15 DIAGNOSIS — N946 Dysmenorrhea, unspecified: Secondary | ICD-10-CM | POA: Insufficient documentation

## 2014-02-15 DIAGNOSIS — Z87891 Personal history of nicotine dependence: Secondary | ICD-10-CM | POA: Insufficient documentation

## 2014-02-15 DIAGNOSIS — N760 Acute vaginitis: Secondary | ICD-10-CM | POA: Insufficient documentation

## 2014-02-15 LAB — URINE MICROSCOPIC-ADD ON

## 2014-02-15 LAB — URINALYSIS, ROUTINE W REFLEX MICROSCOPIC
Glucose, UA: NEGATIVE mg/dL
Ketones, ur: 15 mg/dL — AB
LEUKOCYTES UA: NEGATIVE
Nitrite: NEGATIVE
PH: 6 (ref 5.0–8.0)
Protein, ur: NEGATIVE mg/dL
Specific Gravity, Urine: >1.03 — ABNORMAL HIGH (ref 1.005–1.030)
Urobilinogen, UA: 1 mg/dL (ref 0.0–1.0)

## 2014-02-15 LAB — WET PREP, GENITAL
Trich, Wet Prep: NONE SEEN
Yeast Wet Prep HPF POC: NONE SEEN

## 2014-02-15 LAB — POCT PREGNANCY, URINE: Preg Test, Ur: NEGATIVE

## 2014-02-15 MED ORDER — METRONIDAZOLE 500 MG PO TABS: 500.0000 mg | ORAL_TABLET | Freq: Two times a day (BID) | ORAL | Status: DC

## 2014-02-15 NOTE — Discharge Instructions (Signed)
Bacterial Vaginosis °Bacterial vaginosis is a vaginal infection that occurs when the normal balance of bacteria in the vagina is disrupted. It results from an overgrowth of certain bacteria. This is the most common vaginal infection in women of childbearing age. Treatment is important to prevent complications, especially in pregnant women, as it can cause a premature delivery. °CAUSES  °Bacterial vaginosis is caused by an increase in harmful bacteria that are normally present in smaller amounts in the vagina. Several different kinds of bacteria can cause bacterial vaginosis. However, the reason that the condition develops is not fully understood. °RISK FACTORS °Certain activities or behaviors can put you at an increased risk of developing bacterial vaginosis, including: °· Having a new sex partner or multiple sex partners. °· Douching. °· Using an intrauterine device (IUD) for contraception. °Women do not get bacterial vaginosis from toilet seats, bedding, swimming pools, or contact with objects around them. °SIGNS AND SYMPTOMS  °Some women with bacterial vaginosis have no signs or symptoms. Common symptoms include: °· Grey vaginal discharge. °· A fishlike odor with discharge, especially after sexual intercourse. °· Itching or burning of the vagina and vulva. °· Burning or pain with urination. °DIAGNOSIS  °Your health care provider will take a medical history and examine the vagina for signs of bacterial vaginosis. A sample of vaginal fluid may be taken. Your health care provider will look at this sample under a microscope to check for bacteria and abnormal cells. A vaginal pH test may also be done.  °TREATMENT  °Bacterial vaginosis may be treated with antibiotic medicines. These may be given in the form of a pill or a vaginal cream. A second round of antibiotics may be prescribed if the condition comes back after treatment.  °HOME CARE INSTRUCTIONS  °· Only take over-the-counter or prescription medicines as  directed by your health care provider. °· If antibiotic medicine was prescribed, take it as directed. Make sure you finish it even if you start to feel better. °· Do not have sex until treatment is completed. °· Tell all sexual partners that you have a vaginal infection. They should see their health care provider and be treated if they have problems, such as a mild rash or itching. °· Practice safe sex by using condoms and only having one sex partner. °SEEK MEDICAL CARE IF:  °· Your symptoms are not improving after 3 days of treatment. °· You have increased discharge or pain. °· You have a fever. °MAKE SURE YOU:  °· Understand these instructions. °· Will watch your condition. °· Will get help right away if you are not doing well or get worse. °FOR MORE INFORMATION  °Centers for Disease Control and Prevention, Division of STD Prevention: www.cdc.gov/std °American Sexual Health Association (ASHA): www.ashastd.org  °Document Released: 02/28/2005 Document Revised: 12/19/2012 Document Reviewed: 10/10/2012 °ExitCare® Patient Information ©2015 [employer]. This information is not intended to replace advice given to you by your health care provider. Make sure you discuss any questions you have with your health care provider. °Contraception Choices °Contraception (birth control) is the use of any methods or devices to prevent pregnancy. Below are some methods to help avoid pregnancy. °HORMONAL METHODS  °· Contraceptive implant. This is a thin, plastic tube containing progesterone hormone. It does not contain estrogen hormone. Your health care provider inserts the tube in the inner part of the upper arm. The tube can remain in place for up to 3 years. After 3 years, the implant must be removed. The implant prevents the ovaries from   releasing an egg (ovulation), thickens the cervical mucus to prevent sperm from entering the uterus, and thins the lining of the inside of the uterus. °· Progesterone-only injections. These  injections are given every 3 months by your health care provider to prevent pregnancy. This synthetic progesterone hormone stops the ovaries from releasing eggs. It also thickens cervical mucus and changes the uterine lining. This makes it harder for sperm to survive in the uterus. °· Birth control pills. These pills contain estrogen and progesterone hormone. They work by preventing the ovaries from releasing eggs (ovulation). They also cause the cervical mucus to thicken, preventing the sperm from entering the uterus. Birth control pills are prescribed by a health care provider. Birth control pills can also be used to treat heavy periods. °· Minipill. This type of birth control pill contains only the progesterone hormone. They are taken every day of each month and must be prescribed by your health care provider. °· Birth control patch. The patch contains hormones similar to those in birth control pills. It must be changed once a week and is prescribed by a health care provider. °· Vaginal ring. The ring contains hormones similar to those in birth control pills. It is left in the vagina for 3 weeks, removed for 1 week, and then a new one is put back in place. The patient must be comfortable inserting and removing the ring from the vagina. A health care provider's prescription is necessary. °· Emergency contraception. Emergency contraceptives prevent pregnancy after unprotected sexual intercourse. This pill can be taken right after sex or up to 5 days after unprotected sex. It is most effective the sooner you take the pills after having sexual intercourse. Most emergency contraceptive pills are available without a prescription. Check with your pharmacist. Do not use emergency contraception as your only form of birth control. °BARRIER METHODS  °· Female condom. This is a thin sheath (latex or rubber) that is worn over the penis during sexual intercourse. It can be used with spermicide to increase  effectiveness. °· Female condom. This is a soft, loose-fitting sheath that is put into the vagina before sexual intercourse. °· Diaphragm. This is a soft, latex, dome-shaped barrier that must be fitted by a health care provider. It is inserted into the vagina, along with a spermicidal jelly. It is inserted before intercourse. The diaphragm should be left in the vagina for 6 to 8 hours after intercourse. °· Cervical cap. This is a round, soft, latex or plastic cup that fits over the cervix and must be fitted by a health care provider. The cap can be left in place for up to 48 hours after intercourse. °· Sponge. This is a soft, circular piece of polyurethane foam. The sponge has spermicide in it. It is inserted into the vagina after wetting it and before sexual intercourse. °· Spermicides. These are chemicals that kill or block sperm from entering the cervix and uterus. They come in the form of creams, jellies, suppositories, foam, or tablets. They do not require a prescription. They are inserted into the vagina with an applicator before having sexual intercourse. The process must be repeated every time you have sexual intercourse. °INTRAUTERINE CONTRACEPTION °· Intrauterine device (IUD). This is a T-shaped device that is put in a woman's uterus during a menstrual period to prevent pregnancy. There are 2 types: °¨ Copper IUD. This type of IUD is wrapped in copper wire and is placed inside the uterus. Copper makes the uterus and fallopian tubes produce a fluid that kills   sperm. It can stay in place for 10 years. °¨ Hormone IUD. This type of IUD contains the hormone progestin (synthetic progesterone). The hormone thickens the cervical mucus and prevents sperm from entering the uterus, and it also thins the uterine lining to prevent implantation of a fertilized egg. The hormone can weaken or kill the sperm that get into the uterus. It can stay in place for 3-5 years, depending on which type of IUD is used. °PERMANENT  METHODS OF CONTRACEPTION °· Female tubal ligation. This is when the woman's fallopian tubes are surgically sealed, tied, or blocked to prevent the egg from traveling to the uterus. °· Hysteroscopic sterilization. This involves placing a small coil or insert into each fallopian tube. Your doctor uses a technique called hysteroscopy to do the procedure. The device causes scar tissue to form. This results in permanent blockage of the fallopian tubes, so the sperm cannot fertilize the egg. It takes about 3 months after the procedure for the tubes to become blocked. You must use another form of birth control for these 3 months. °· Female sterilization. This is when the female has the tubes that carry sperm tied off (vasectomy). This blocks sperm from entering the vagina during sexual intercourse. After the procedure, the man can still ejaculate fluid (semen). °NATURAL PLANNING METHODS °· Natural family planning. This is not having sexual intercourse or using a barrier method (condom, diaphragm, cervical cap) on days the woman could become pregnant. °· Calendar method. This is keeping track of the length of each menstrual cycle and identifying when you are fertile. °· Ovulation method. This is avoiding sexual intercourse during ovulation. °· Symptothermal method. This is avoiding sexual intercourse during ovulation, using a thermometer and ovulation symptoms. °· Post-ovulation method. This is timing sexual intercourse after you have ovulated. °Regardless of which type or method of contraception you choose, it is important that you use condoms to protect against the transmission of sexually transmitted infections (STIs). Talk with your health care provider about which form of contraception is most appropriate for you. °Document Released: 02/28/2005 Document Revised: 03/05/2013 Document Reviewed: 08/23/2012 °ExitCare® Patient Information ©2015 [employer]. This information is not intended to replace advice given to you by  your health care provider. Make sure you discuss any questions you have with your health care provider. ° °

## 2014-02-15 NOTE — MAU Note (Signed)
Pt states here for irregular menstrual cycles. States period began 02/06/2014, stopped two days ago. Now spotting again. Not on birth control. Has never had an irregular cycle. Denies pain.

## 2014-02-15 NOTE — MAU Provider Note (Signed)
Chief Complaint: Dysmenorrhea  First Provider Initiated Contact with Patient 02/15/14 1748     SUBJECTIVE HPI: Jennifer Campbell is a 23 y.o. female who presents with spotting x 2 days and increased, malodorous vaginal discharge. LMP 02/06/14. Usually has monthly cycles. Not on hormonal BC. No recent discontinuation of hormonal BC. Inconsistent condom use. Sexually active Denies fever, chills, abd pain.     History reviewed. No pertinent past medical history. OB History  No data available   History reviewed. No pertinent past surgical history. History   Social History  . Marital Status: Single    Spouse Name: N/A    Number of Children: N/A  . Years of Education: N/A   Occupational History  . Not on file.   Social History Main Topics  . Smoking status: Former Smoker -- 0.15 packs/day    Types: Cigarettes  . Smokeless tobacco: Not on file  . Alcohol Use: No  . Drug Use: No  . Sexual Activity: Yes   Other Topics Concern  . Not on file   Social History Narrative   No current facility-administered medications on file prior to encounter.   Current Outpatient Prescriptions on File Prior to Encounter  Medication Sig Dispense Refill  . diphenhydrAMINE (BENADRYL) 25 MG tablet Take 50 mg by mouth once.    . diphenhydrAMINE (BENADRYL) 25 MG tablet Take 1 tablet (25 mg total) by mouth every 6 (six) hours as needed for itching (Rash). 30 tablet 0  . famotidine (PEPCID) 20 MG tablet Take 1 tablet (20 mg total) by mouth 2 (two) times daily. 30 tablet 0  . hydrocortisone cream 1 % Apply 1 application topically 2 (two) times daily. Do not apply to face 15 g 1  . naproxen (NAPROSYN) 500 MG tablet Take 1 tablet (500 mg total) by mouth 2 (two) times daily. 30 tablet 0  . permethrin (ELIMITE) 5 % cream Apply to entire body other than face - let sit for 14 hours then wash off, may repeat in 1 week if still having symptoms 60 g 1  . predniSONE (DELTASONE) 20 MG tablet Take 3 tablets (60 mg  total) by mouth daily. 15 tablet 0   No Known Allergies  ROS: Pertinent items in HPI. No urinary complaints, flank pain.   OBJECTIVE Blood pressure 120/59, pulse 60, temperature 98.5 F (36.9 C), temperature source Oral, resp. rate 16, height 5\' 2"  (1.575 m), weight 55.396 kg (122 lb 2 oz), last menstrual period 02/13/2014. GENERAL: Well-developed, well-nourished female in no acute distress.  HEENT: Normocephalic HEART: normal rate RESP: normal effort ABDOMEN: Soft, non-tender EXTREMITIES: Nontender, no edema NEURO: Alert and oriented SPECULUM EXAM: NEFG, moderate amount of tan, creamy, malodorous discharge, no blood noted, cervix clean BIMANUAL: cervix closed; uterus normal size, no adnexal tenderness or masses. No CMT.  LAB RESULTS Results for orders placed or performed during the hospital encounter of 02/15/14 (from the past 24 hour(s))  Urinalysis, Routine w reflex microscopic     Status: Abnormal   Collection Time: 02/15/14  5:25 PM  Result Value Ref Range   Color, Urine YELLOW YELLOW   APPearance CLEAR CLEAR   Specific Gravity, Urine >1.030 (H) 1.005 - 1.030   pH 6.0 5.0 - 8.0   Glucose, UA NEGATIVE NEGATIVE mg/dL   Hgb urine dipstick LARGE (A) NEGATIVE   Bilirubin Urine SMALL (A) NEGATIVE   Ketones, ur 15 (A) NEGATIVE mg/dL   Protein, ur NEGATIVE NEGATIVE mg/dL   Urobilinogen, UA 1.0 0.0 - 1.0 mg/dL  Nitrite NEGATIVE NEGATIVE   Leukocytes, UA NEGATIVE NEGATIVE  Urine microscopic-add on     Status: Abnormal   Collection Time: 02/15/14  5:25 PM  Result Value Ref Range   Squamous Epithelial / LPF FEW (A) RARE   WBC, UA 0-2 <3 WBC/hpf   RBC / HPF 3-6 <3 RBC/hpf   Bacteria, UA MANY (A) RARE   Urine-Other MUCOUS PRESENT   Pregnancy, urine POC     Status: None   Collection Time: 02/15/14  5:32 PM  Result Value Ref Range   Preg Test, Ur NEGATIVE NEGATIVE  Wet prep, genital     Status: Abnormal   Collection Time: 02/15/14  6:10 PM  Result Value Ref Range   Yeast Wet  Prep HPF POC NONE SEEN NONE SEEN   Trich, Wet Prep NONE SEEN NONE SEEN   Clue Cells Wet Prep HPF POC FEW (A) NONE SEEN   WBC, Wet Prep HPF POC FEW (A) NONE SEEN    IMAGING No results found.  MAU COURSE  ASSESSMENT 1. BV (bacterial vaginosis)     PLAN Discharge home in stable condition. GC/Chlam, urine cultures pending.  Encouraged BC use and establishing care w/Gyn. Lists given Follow-up Information    Follow up with Gynecologist.   Why:  for routine care and Pap smear      [facility]   Why:  As needed in emergencies   Contact information:   [address] [id number] mc [place] [id number] [phone]       Medication List    STOP taking these medications        diphenhydrAMINE 25 MG tablet  Commonly known as:  BENADRYL     famotidine 20 MG tablet  Commonly known as:  PEPCID     hydrocortisone cream 1 %     naproxen 500 MG tablet  Commonly known as:  NAPROSYN     permethrin 5 % cream  Commonly known as:  ELIMITE     predniSONE 20 MG tablet  Commonly known as:  DELTASONE      TAKE these medications        metroNIDAZOLE 500 MG tablet  Commonly known as:  FLAGYL  Take 1 tablet (500 mg total) by mouth 2 (two) times daily.       [name], [place] 02/15/2014  6:35 PM

## 2014-02-17 LAB — GC/CHLAMYDIA PROBE AMP
CT PROBE, AMP APTIMA: NEGATIVE
GC Probe RNA: NEGATIVE

## 2014-03-09 ENCOUNTER — Emergency Department (HOSPITAL_COMMUNITY): Payer: Medicaid Other

## 2014-03-09 ENCOUNTER — Encounter (HOSPITAL_COMMUNITY): Payer: Self-pay | Admitting: *Deleted

## 2014-03-09 ENCOUNTER — Emergency Department (HOSPITAL_COMMUNITY)
Admission: EM | Admit: 2014-03-09 | Discharge: 2014-03-10 | Disposition: A | Discharge status: Home / Self Care | Payer: Medicaid Other | Attending: Emergency Medicine | Admitting: Emergency Medicine

## 2014-03-09 DIAGNOSIS — Z87891 Personal history of nicotine dependence: Secondary | ICD-10-CM | POA: Insufficient documentation

## 2014-03-09 DIAGNOSIS — R1084 Generalized abdominal pain: Secondary | ICD-10-CM | POA: Insufficient documentation

## 2014-03-09 DIAGNOSIS — Z3202 Encounter for pregnancy test, result negative: Secondary | ICD-10-CM | POA: Insufficient documentation

## 2014-03-09 DIAGNOSIS — R110 Nausea: Secondary | ICD-10-CM | POA: Insufficient documentation

## 2014-03-09 DIAGNOSIS — R42 Dizziness and giddiness: Secondary | ICD-10-CM | POA: Insufficient documentation

## 2014-03-09 DIAGNOSIS — Z792 Long term (current) use of antibiotics: Secondary | ICD-10-CM | POA: Insufficient documentation

## 2014-03-09 DIAGNOSIS — R6881 Early satiety: Secondary | ICD-10-CM

## 2014-03-09 DIAGNOSIS — R55 Syncope and collapse: Secondary | ICD-10-CM

## 2014-03-09 LAB — URINALYSIS, ROUTINE W REFLEX MICROSCOPIC
Bilirubin Urine: NEGATIVE
Glucose, UA: NEGATIVE mg/dL
Ketones, ur: NEGATIVE mg/dL
Leukocytes, UA: NEGATIVE
NITRITE: NEGATIVE
Protein, ur: NEGATIVE mg/dL
Specific Gravity, Urine: 1.009 (ref 1.005–1.030)
UROBILINOGEN UA: 1 mg/dL (ref 0.0–1.0)
pH: 6 (ref 5.0–8.0)

## 2014-03-09 LAB — CBC
HCT: 35.4 % — ABNORMAL LOW (ref 36.0–46.0)
Hemoglobin: 12.3 g/dL (ref 12.0–15.0)
MCH: 31.8 pg (ref 26.0–34.0)
MCHC: 34.7 g/dL (ref 30.0–36.0)
MCV: 91.5 fL (ref 78.0–100.0)
Platelets: 240 10*3/uL (ref 150–400)
RBC: 3.87 MIL/uL (ref 3.87–5.11)
RDW: 11.9 % (ref 11.5–15.5)
WBC: 5.6 10*3/uL (ref 4.0–10.5)

## 2014-03-09 LAB — URINE MICROSCOPIC-ADD ON

## 2014-03-09 LAB — BASIC METABOLIC PANEL
Anion gap: 5 (ref 5–15)
BUN: 7 mg/dL (ref 6–23)
CALCIUM: 8.9 mg/dL (ref 8.4–10.5)
CO2: 27 mmol/L (ref 19–32)
Chloride: 105 mEq/L (ref 96–112)
Creatinine, Ser: 0.77 mg/dL (ref 0.50–1.10)
GFR calc Af Amer: >90 mL/min (ref >90)
GLUCOSE: 82 mg/dL (ref 70–99)
Potassium: 3.3 mmol/L — ABNORMAL LOW (ref 3.5–5.1)
Sodium: 137 mmol/L (ref 135–145)

## 2014-03-09 LAB — POC URINE PREG, ED: PREG TEST UR: NEGATIVE

## 2014-03-09 LAB — HEPATIC FUNCTION PANEL
ALT: 10 U/L (ref 0–35)
AST: 18 U/L (ref 0–37)
Albumin: 4.2 g/dL (ref 3.5–5.2)
Alkaline Phosphatase: 49 U/L (ref 39–117)
BILIRUBIN DIRECT: 0.1 mg/dL (ref 0.0–0.3)
Indirect Bilirubin: 0.5 mg/dL (ref 0.3–0.9)
Total Bilirubin: 0.6 mg/dL (ref 0.3–1.2)
Total Protein: 7.2 g/dL (ref 6.0–8.3)

## 2014-03-09 LAB — LIPASE, BLOOD: LIPASE: 21 U/L (ref 11–59)

## 2014-03-09 MED ORDER — POTASSIUM CHLORIDE CRYS ER 20 MEQ PO TBCR
20.0000 meq | EXTENDED_RELEASE_TABLET | Freq: Once | ORAL | Status: AC
Start: 2014-03-10 — End: 2014-03-10
  Administered 2014-03-10: 20 meq via ORAL
  Filled 2014-03-09: qty 1

## 2014-03-09 MED ORDER — SODIUM CHLORIDE 0.9 % IV BOLUS (SEPSIS)
1000.0000 mL | Freq: Once | INTRAVENOUS | Status: AC
  Administered 2014-03-09: 1000 mL via INTRAVENOUS

## 2014-03-09 MED ORDER — ONDANSETRON HCL 4 MG/2ML IJ SOLN
4.0000 mg | Freq: Once | INTRAMUSCULAR | Status: AC
  Administered 2014-03-09: 4 mg via INTRAVENOUS
  Filled 2014-03-09 (×2): qty 2

## 2014-03-09 MED ORDER — ONDANSETRON HCL 4 MG PO TABS: 4.0000 mg | ORAL_TABLET | Freq: Three times a day (TID) | ORAL | Status: DC | PRN

## 2014-03-09 NOTE — ED Notes (Signed)
Pt reports her roommate told her she passed out. Pt unsure. Pt reports dizziness and weakness since today. Denies pain.

## 2014-03-09 NOTE — ED Notes (Signed)
Patient denies having nausea after drinking the ice water. Provided patient blankets and she is talking on telephone.

## 2014-03-09 NOTE — ED Notes (Signed)
Provided patient a cup of water for po challenge.

## 2014-03-09 NOTE — Discharge Instructions (Signed)
Read the information below.  Use the prescribed medication as directed.  Please discuss all new medications with your pharmacist.  You may return to the Emergency Department at any time for worsening condition or any new symptoms that concern you.  If you develop high fevers, worsening abdominal pain, uncontrolled vomiting, or are unable to tolerate fluids by mouth, return to the ER for a recheck.    It is very important that you follow up with a primary care provider for further evaluation.   Nausea, Adult Nausea is the feeling that you have an upset stomach or have to vomit. Nausea by itself is not likely a serious concern, but it may be an early sign of more serious medical problems. As nausea gets worse, it can lead to vomiting. If vomiting develops, there is the risk of dehydration.  CAUSES   Viral infections.  Food poisoning.  Medicines.  Pregnancy.  Motion sickness.  Migraine headaches.  Emotional distress.  Severe pain from any source.  Alcohol intoxication. HOME CARE INSTRUCTIONS  Get plenty of rest.  Ask your caregiver about specific rehydration instructions.  Eat small amounts of food and sip liquids more often.  Take all medicines as told by your caregiver. SEEK MEDICAL CARE IF:  You have not improved after 2 days, or you get worse.  You have a headache. SEEK IMMEDIATE MEDICAL CARE IF:   You have a fever.  You faint.  You keep vomiting or have blood in your vomit.  You are extremely weak or dehydrated.  You have dark or bloody stools.  You have severe chest or abdominal pain. MAKE SURE YOU:  Understand these instructions.  Will watch your condition.  Will get help right away if you are not doing well or get worse. Document Released: 04/07/2004 Document Revised: 11/23/2011 Document Reviewed: 11/10/2010 [facility] Patient Information 2015 [place], [place]. This information is not intended to replace advice given to you by your health care provider.  Make sure you discuss any questions you have with your health care provider.   Emergency Department Resource Guide 1) Find a Doctor and Pay Out of Pocket Although you won't have to find out who is covered by your insurance plan, it is a good idea to ask around and get recommendations. You will then need to call the office and see if the doctor you have chosen will accept you as a new patient and what types of options they offer for patients who are self-pay. Some doctors offer discounts or will set up payment plans for their patients who do not have insurance, but you will need to ask so you aren't surprised when you get to your appointment.  2) Contact Your Local Health Department Not all health departments have doctors that can see patients for sick visits, but many do, so it is worth a call to see if yours does. If you don't know where your local health department is, you can check in your phone book. The CDC also has a tool to help you locate your state's health department, and many state websites also have listings of all of their local health departments.  3) Find a Walk-in Clinic If your illness is not likely to be very severe or complicated, you may want to try a walk in clinic. These are popping up all over the country in pharmacies, drugstores, and shopping centers. They're usually staffed by nurse practitioners or physician assistants that have been trained to treat common illnesses and complaints. They're usually fairly  quick and inexpensive. However, if you have serious medical issues or chronic medical problems, these are probably not your best option.  No Primary Care Doctor: - Call Health Connect at  [phone] - they can help you locate a primary care doctor that  accepts your insurance, provides certain services, etc. - Physician Referral Service- [phone]  Chronic Pain Problems: Organization         Address  Phone   Notes  [facility]  [phone]  Patients need to be referred by their primary care doctor.   Medication Assistance: Organization         Address  Phone   Notes  [facility]ication [facility] [address] [place], [place] [address] [phone]) [phone] --Must be a resident of [facility] -- Must have NO insurance coverage whatsoever (no Medicaid/ Medicare, etc.) -- The pt. MUST have a primary care doctor that directs their care regularly and follows them in the community   MedAssist  [phone]) [phone]   [employer]  [phone]) [phone]    Agencies that provide inexpensive medical care: Organization         Address  Phone   Notes  [facility]  [phone]   [facility]    [phone]   [facility] [address] [place]ria [place], [place] [address] [phone]   [facility] [address], [place] [phone]   Planned Parenthood    [phone]) [phone]   [facility]    [phone]   [facility]  [address], [place] Phone:  [phone]) [phone], Fax:  [phone] Hours of Operation:  9 am - 6 pm, M-F.  Also accepts Medicaid/Medicare and self-pay.  [facility]  [address], [place] Phone: [phone], Fax: [phone]. Hours of Operation:  8:30 am - 5:30 pm, M-F.  Also accepts Medicaid and self-pay.  [facility] [place] [address], [place] Phone: [phone]) [phone]   [facility] [address], [place], [place] [phone], Ext. [phone] Mondays & Thursdays: 7-9 AM.  First 15 patients are seen on a first come, first serve basis.    Medicaid-accepting [facility] Providers:  Organization         Address  Phone   Notes  [facility] [address], [place] [phone] Also accepts self-pay patients.  [facility] [address], [place]  [phone])  [phone]   [facility] [address], [place] [phone]) [phone]   [facility] [address] [place] [address], [place] [phone]) [phone]   Renaye RakersVeita Bland [address], [place]   [phone] Only accepts [place] Access [place] patients after they have their name applied to their card.   Self-Pay (no insurance) in [facility]:  Organization         Address  Phone   Notes  Sickle Cell Patients, [facility] [address], [place] [phone]   [facility] [address], [place] [phone]) [phone]   [facility] [place]  [address], [place] [phone]) [phone]   [facility]/Dr. Osei-Bonsu  [address] [place] [address], [place] or [id number], [place] [phone] Phone number  for both [place] and [place] locations is the same.  [facility] [address], [place] [phone]   [facility] [place] [address] [place] [address], [place] or [address] [phone]   [facility] [address], [place] [phone], phone; [phone]) [phone], fax Sees patients 1st and 3rd Saturday of every month.  Must not qualify for public or private insurance (i.e. Medicaid, Medicare, [place] Health Choice, Veterans' Benefits)  Household income should be no more than 200% of the poverty level The clinic cannot treat you if you are pregnant or think you are pregnant  Sexually transmitted diseases are not treated at the clinic.    Dental Care: Organization         Address  Phone  Notes  [facility] Department of [facility] [address], [place] [phone] Accepts children up to age 23 who are enrolled in [place]icaid or [place] Health Choice; pregnant women with a Medicaid card; and children who have applied for Medicaid or [place] Health Choice, but were  declined, whose parents can pay a reduced fee at time of service.  [facility] Department of [facility] [place]  [address], [place] [phone]) [phone] Accepts children up to age 23 who are enrolled in [place]icaid or [place] Health Choice; pregnant women with a Medicaid card; and children who have applied for Medicaid or [place] Health Choice, but were declined, whose parents can pay a reduced fee at time of service.  [facility]  [address], [place] [phone] Patients are seen by appointment only. Walk-ins are not accepted. [facility] will see patients 23 years of age and older. Monday - Tuesday (8am-5pm) Most Wednesdays (8:30-5pm) $30 per visit, cash only  [facility]  [address], [place] [facility] [phone] Patients are seen by appointment only. Walk-ins are not accepted. [facility] will see patients 23 years of age and older. One Wednesday Evening (Monthly: Volunteer Based).  $30 per visit, cash only  [facility]  [phone] for adults; Children under age 24, call Graduate Pediatric Dentistry at [phone]. Children aged 664-14, please call [phone] to request a pediatric application.  Dental services are provided in all areas of dental care including fillings, crowns and bridges, complete and partial dentures, implants, gum treatment, root canals, and extractions. Preventive care is also provided. Treatment is provided to both adults and children. Patients are selected via a lottery and there is often a waiting list.   [facility] [address], [place]  [phone] [email or web address]   [facility] [address], [place], [place] [phone], Ext. [phone] Second and Fourth Thursday of each month, opens at 6:30 AM; Clinic ends at 9 AM.  Patients are seen on a first-come first-served basis, and a limited number are seen during each clinic.    [facility]  [address], [place], [place] [phone]   Eligibility Requirements You must have lived in [place], [place], or [place] counties for at least the last three months.   You cannot be eligible for state or federal sponsored National Cityhealthcare insurance, including CIGNAVeterans Administration, [place]icaid, or [employer]Medicare.   You generally cannot be eligible for healthcare insurance through your employer.    How to apply: Eligibility screenings are held every Tuesday and Wednesday afternoon from 1:00 pm until  4:00 pm. You do not need an appointment for the interview!  [facility] [address], [place], [place] [phone]   [facility] Health Department  [phone]   [facility] Health Department  [phone]   [facility] Health Department  [phone]    Behavioral Health Resources in the Community: Intensive Outpatient Programs Organization         Address  Phone  Notes  [facility] Services [address] [place], [place] [phone]   [facility] Outpatient [address], [place], [place] [phone]   ADS: Alcohol & Drug Svcs [address], [place], [place]  [phone]   [facility] Mental Health [address]  [place], [place] [phone] or [phone]   Substance Abuse Resources Organization         Address  Phone  Notes  Alcohol and Drug Services  [phone]   [facility]  [phone]   [facility] [place]  [phone]   Floydene Flock  [phone]   Residential & Outpatient Substance Abuse Program  [phone]   Psychological Services Organization         Address  Phone  Notes  [facility] Health  [phone]   [facility] Services  [phone]   [facility] Mental Health [address] [place] [phone] or [phone]    Mobile Crisis Teams Organization         Address  Phone  Notes  [facility], Mobile Crisis Care  Unit  [phone]   [facility]  [address] [place], [place] [phone]   Doristine Locks [address] [place] [phone]    Self-Help/Support Groups Organization         Address  Phone             Notes  [facility]oc[facility] [place] - variety of support groups  336- I7437963 Call for more information  Narcotics Anonymous (NA), [facility] [address], [employer] [place]  2 meetings at this location   Statistician         Address  Phone  Notes  [facility] [address],    [place]  [phone]   [facility]  [address] [place] [address], [place], [place] [phone]   [facility] [address] [place], [place] [phone] Admissions: 8am-3pm M-F  [facility] [address]    [place], [place] [phone]   [facility] [address] [place], [place], [place] [phone]   [facility] [address]  [place], [place] [phone]   [facility] - Intensive Outpatient [address], [place], [place] [phone]   [facility] [address] [place].,  [place], [place] [phone] or [phone]   [facility] (RTS) [address], [place], [place] [phone] Accepts Medicaid  [facility] [address]  [place] [phone] Substance Abuse/Addiction Treatment   [facility] Organization         Address  Phone  Notes  [facility]  2133732229   Angie Fava, PhD [address] [place], [place]   [phone] or [phone]   [facility] Behavioral   [address] [place], [place] [phone]   [facility] [address], [place], [place] [phone] Insurance/Medicaid/sponsorship through [employer] and Families [address]  [place], [place] [phone] [place], [place] [phone]    Dr. Adele Schilder  [phone]   [facility] Dept. 1) [address], [place] 2) [place] 3)  [address], [place] [phone]  [phone]   [place] [phone] or [phone] (After Hours)

## 2014-03-09 NOTE — ED Notes (Signed)
Ultrasound at bedside. Pt denies being nausea. Therefore, will hold on Zofran order. Will start 2 liter of NS when scan is completed. Pt is tolerating scan well.

## 2014-03-09 NOTE — ED Notes (Signed)
Pt. Made aware for the need of urine. 

## 2014-03-09 NOTE — ED Provider Notes (Signed)
CSN: [id number]     Arrival date & time 03/09/14  1720 History   First MD Initiated Contact with Patient 03/09/14 1927     Chief Complaint  Patient presents with  . Dizziness  . Weakness     (Consider location/radiation/quality/duration/timing/severity/associated sxs/prior Treatment) The history is provided by the patient.     Patient presents with lightheadedness, generalized weakness, 46 pound weight loss over 6 months, early satiety, nausea with eating,  occasional periumbilical abdominal pain.   Has also had abnormal uterine bleeding, [facility], was diagnosed with BV but was unable to take the pills because of her symptoms.  This is all ongoing for the past 6 months.  Today pt states she was washing dishes and then remembers waking up on the couch, her roommate tells her she passed out.  Denies fevers, night sweats, CP, SOB, palpitations.  LMP within the past few days.   History reviewed. No pertinent past medical history. History reviewed. No pertinent past surgical history. Family History  Problem Relation Age of Onset  . Depression Mother   . Asthma Mother   . Asthma Maternal Aunt   . Heart disease Maternal Grandfather   . Depression Paternal Grandmother    History  Substance Use Topics  . Smoking status: Former Smoker -- 0.15 packs/day    Types: Cigarettes  . Smokeless tobacco: Not on file  . Alcohol Use: No   OB History    No data available     Review of Systems  All other systems reviewed and are negative.     Allergies  Review of patient's allergies indicates no known allergies.  Home Medications   Prior to Admission medications   Medication Sig Start Date End Date Taking? Authorizing Provider  metroNIDAZOLE (FLAGYL) 500 MG tablet Take 1 tablet (500 mg total) by mouth 2 (two) times daily. 02/15/14   Virginia Smith, CNM   BP 118/70 mmHg  Pulse 50  Temp(Src) 98 F (36.7 C) (Oral)  Resp 20  SpO2 100%  LMP  02/13/2014 Physical Exam  Constitutional: She is oriented to person, place, and time. She appears well-developed and well-nourished. No distress.  HENT:  Head: Normocephalic and atraumatic.  Neck: Neck supple.  Cardiovascular: Normal rate and regular rhythm.   Pulmonary/Chest: Effort normal and breath sounds normal. No respiratory distress. She has no wheezes. She has no rales.  Abdominal: Soft. She exhibits no distension. There is generalized tenderness. There is no rebound and no guarding.  Neurological: She is alert and oriented to person, place, and time. She exhibits normal muscle tone. GCS eye subscore is 4. GCS verbal subscore is 5. GCS motor subscore is 6.  Skin: She is not diaphoretic.  Psychiatric: She has a normal mood and affect. Her behavior is normal.  Nursing note and vitals reviewed.   ED Course  Procedures (including critical care time) Labs Review Labs Reviewed  BASIC METABOLIC PANEL - Abnormal; Notable for the following:    Potassium 3.3 (*)    All other components within normal limits  CBC - Abnormal; Notable for the following:    HCT 35.4 (*)    All other components within normal limits  URINALYSIS, ROUTINE W REFLEX MICROSCOPIC - Abnormal; Notable for the following:    APPearance CLOUDY (*)    Hgb urine dipstick LARGE (*)    All other components within normal limits  URINE MICROSCOPIC-ADD ON - Abnormal; Notable for the following:    Squamous Epithelial /  LPF MANY (*)    Bacteria, UA FEW (*)    All other components within normal limits  HEPATIC FUNCTION PANEL  LIPASE, BLOOD  POC URINE PREG, ED    Imaging Review Kore[place] Abdomen Complete  03/09/2014   CLINICAL DATA:  Nausea and early satiety  EXAM: ULTRASOUND ABDOMEN COMPLETE  COMPARISON:  None.  FINDINGS: Gallbladder: No gallstones or wall thickening visualized. No sonographic Murphy sign noted.  Common bile duct: Diameter: 1.6 mm.  Liver: No focal lesion identified. Within normal limits in parenchymal  echogenicity.  IVC: No abnormality visualized.  Pancreas: Visualized portion unremarkable.  Spleen: Size and appearance within normal limits.  Right Kidney: Length: 9.6 cm. Echogenicity within normal limits. No mass or hydronephrosis visualized.  Left Kidney: Length: 9.5 cm. Echogenicity within normal limits. No mass or hydronephrosis visualized.  Abdominal aorta: No aneurysm visualized.  Other findings: None.  IMPRESSION: No acute abnormality noted.   Electronically Signed   By: Alcide CleverMark  Lukens M.D.   On: 03/09/2014 21:16     EKG Interpretation   Date/Time:  Sunday March 09 2014 19:49:02 EST Ventricular Rate:  51 PR Interval:  118 QRS Duration: 72 QT Interval:  420 QTC Calculation: 387 R Axis:   81 Text Interpretation:  Sinus rhythm Atrial premature complexes in couplets  Borderline short PR interval No old tracing to compare Confirmed by KNAPP   MD-J, JON ([id number]) on 03/09/2014 7:58:55 PM      ,11:48 PM Pt is tolerating PO.  MDM   Final diagnoses:  Early satiety  Nausea  Syncope, unspecified syncope type    Afebrile, nontoxic patient with syncope today, also 6 months of weight loss, decreased appetite, early satiety, nausea.  Labs unremarkable.  Abdominal US negative.  EKG and cardiac monitoring without concerning events.  She is not pregnant.  Her chronic symptoms of weight loss, nausea, and not tolerating PO are concerning and have I strongly advised her to follow up with PCP.  Resources given.  Pt also given GI follow up.  D/C home with zofran, PCP, GI follow up.  Discussed result, findings, treatment, and follow up  with patient.  Pt given return precautions.  Pt verbalizes understanding and agrees with plan.         Trixie Dredgemily Jamicheal Heard, PA-C 03/10/14 0022  Linwood DibblesJon Knapp, MD 03/11/14 [phone]

## 2014-03-09 NOTE — ED Notes (Signed)
EKG given to EDP,Knapp,J. For review. 

## 2014-03-27 ENCOUNTER — Encounter (HOSPITAL_COMMUNITY): Payer: Self-pay | Admitting: Emergency Medicine

## 2014-03-27 ENCOUNTER — Emergency Department (HOSPITAL_COMMUNITY)
Admission: EM | Admit: 2014-03-27 | Discharge: 2014-03-27 | Disposition: A | Discharge status: Home / Self Care | Payer: No Typology Code available for payment source | Attending: Emergency Medicine | Admitting: Emergency Medicine

## 2014-03-27 DIAGNOSIS — S199XXA Unspecified injury of neck, initial encounter: Secondary | ICD-10-CM | POA: Insufficient documentation

## 2014-03-27 DIAGNOSIS — M549 Dorsalgia, unspecified: Secondary | ICD-10-CM

## 2014-03-27 DIAGNOSIS — M62838 Other muscle spasm: Secondary | ICD-10-CM

## 2014-03-27 DIAGNOSIS — Y92410 Unspecified street and highway as the place of occurrence of the external cause: Secondary | ICD-10-CM | POA: Diagnosis not present

## 2014-03-27 DIAGNOSIS — Z87891 Personal history of nicotine dependence: Secondary | ICD-10-CM | POA: Insufficient documentation

## 2014-03-27 DIAGNOSIS — S3992XA Unspecified injury of lower back, initial encounter: Secondary | ICD-10-CM | POA: Diagnosis not present

## 2014-03-27 DIAGNOSIS — Z792 Long term (current) use of antibiotics: Secondary | ICD-10-CM | POA: Diagnosis not present

## 2014-03-27 DIAGNOSIS — S8011XA Contusion of right lower leg, initial encounter: Secondary | ICD-10-CM | POA: Insufficient documentation

## 2014-03-27 DIAGNOSIS — Y9389 Activity, other specified: Secondary | ICD-10-CM | POA: Diagnosis not present

## 2014-03-27 DIAGNOSIS — Y998 Other external cause status: Secondary | ICD-10-CM | POA: Insufficient documentation

## 2014-03-27 MED ORDER — HYDROCODONE-ACETAMINOPHEN 5-325 MG PO TABS: 1.0000 | ORAL_TABLET | ORAL | Status: DC | PRN

## 2014-03-27 MED ORDER — METHOCARBAMOL 500 MG PO TABS: 500.0000 mg | ORAL_TABLET | Freq: Two times a day (BID) | ORAL | Status: DC

## 2014-03-27 NOTE — Discharge Instructions (Signed)
Take the prescribed medication as directed.  You may continue to be sore for the next few days which is normal, soreness should gradually continue to improve. Return to the ED for new or worsening symptoms.
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
## 2014-03-31 ENCOUNTER — Encounter (HOSPITAL_COMMUNITY): Payer: Self-pay | Admitting: Emergency Medicine

## 2014-03-31 ENCOUNTER — Emergency Department (HOSPITAL_COMMUNITY)
Admission: EM | Admit: 2014-03-31 | Discharge: 2014-03-31 | Disposition: A | Discharge status: Home / Self Care | Payer: No Typology Code available for payment source | Attending: Emergency Medicine | Admitting: Emergency Medicine

## 2014-03-31 ENCOUNTER — Emergency Department (HOSPITAL_COMMUNITY): Payer: No Typology Code available for payment source

## 2014-03-31 DIAGNOSIS — R51 Headache: Secondary | ICD-10-CM | POA: Insufficient documentation

## 2014-03-31 DIAGNOSIS — Z79899 Other long term (current) drug therapy: Secondary | ICD-10-CM | POA: Diagnosis not present

## 2014-03-31 DIAGNOSIS — M542 Cervicalgia: Secondary | ICD-10-CM | POA: Diagnosis present

## 2014-03-31 DIAGNOSIS — Z792 Long term (current) use of antibiotics: Secondary | ICD-10-CM | POA: Diagnosis not present

## 2014-03-31 DIAGNOSIS — G8911 Acute pain due to trauma: Secondary | ICD-10-CM | POA: Diagnosis not present

## 2014-03-31 DIAGNOSIS — R52 Pain, unspecified: Secondary | ICD-10-CM

## 2014-03-31 DIAGNOSIS — Z87891 Personal history of nicotine dependence: Secondary | ICD-10-CM | POA: Insufficient documentation

## 2014-03-31 DIAGNOSIS — M436 Torticollis: Secondary | ICD-10-CM

## 2014-03-31 MED ORDER — METHOCARBAMOL 500 MG PO TABS: 1000.0000 mg | ORAL_TABLET | Freq: Four times a day (QID) | ORAL | Status: DC | PRN
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
## 2014-03-31 NOTE — Discharge Instructions (Signed)
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
## 2014-05-14 ENCOUNTER — Encounter (HOSPITAL_COMMUNITY): Payer: Self-pay | Admitting: *Deleted

## 2014-05-14 ENCOUNTER — Emergency Department (HOSPITAL_COMMUNITY): Payer: Medicaid Other

## 2014-05-14 ENCOUNTER — Emergency Department (HOSPITAL_COMMUNITY)
Admission: EM | Admit: 2014-05-14 | Discharge: 2014-05-14 | Disposition: A | Discharge status: Home / Self Care | Payer: Medicaid Other | Attending: Emergency Medicine | Admitting: Emergency Medicine

## 2014-05-14 DIAGNOSIS — R059 Cough, unspecified: Secondary | ICD-10-CM

## 2014-05-14 DIAGNOSIS — R058 Other specified cough: Secondary | ICD-10-CM

## 2014-05-14 DIAGNOSIS — Z79899 Other long term (current) drug therapy: Secondary | ICD-10-CM | POA: Insufficient documentation

## 2014-05-14 DIAGNOSIS — R05 Cough: Secondary | ICD-10-CM | POA: Insufficient documentation

## 2014-05-14 DIAGNOSIS — B349 Viral infection, unspecified: Secondary | ICD-10-CM | POA: Insufficient documentation

## 2014-05-14 DIAGNOSIS — Z87891 Personal history of nicotine dependence: Secondary | ICD-10-CM | POA: Insufficient documentation

## 2014-05-14 MED ORDER — HYDROCODONE-HOMATROPINE 5-1.5 MG/5ML PO SYRP: 5.0000 mL | ORAL_SOLUTION | Freq: Four times a day (QID) | ORAL | Status: DC | PRN

## 2014-05-14 MED ORDER — ALBUTEROL SULFATE HFA 108 (90 BASE) MCG/ACT IN AERS
2.0000 | INHALATION_SPRAY | Freq: Once | RESPIRATORY_TRACT | Status: AC
  Administered 2014-05-14: 2 via RESPIRATORY_TRACT
  Filled 2014-05-14: qty 6.7

## 2014-05-14 NOTE — ED Notes (Signed)
Pt states that she has had a cough for 2 weeks; pt states it the first few days she had a fever and congestion but that subsided after a couple of days and just has had a dry hacking cough for almost 2 weeks; pt states cough is non-productive; pt denies shortness of breath

## 2014-05-14 NOTE — ED Provider Notes (Signed)
CSN: [id number]     Arrival date & time 05/14/14  1956 History  This chart was scribed for non-physician practitioner, Emilia BeckKaitlyn Seraya Jobst, PA-C working with Richardean Canalavid H Yao, MD by Gwenyth Oberatherine Macek, ED scribe. This patient was seen in room WTR9/WTR9 and the patient's care was started at 9:59 PM   Chief Complaint  Patient presents with  . Cough   The history is provided by the patient. No language interpreter was used.    HPI Comments: Jennifer Campbell is a 24 y.o. female with no chronic medical conditions who presents to the Emergency Department complaining of gradually worsening, intermittent, nonproductive cough that started 2 weeks ago. Pt reports that fever and congestion initially accompanied the cough, but resolved after the first few days of symptoms. She tried cough suppressant with no relief. Pt denies sick contact. She also denies sore throat and sinus pressure as associated symptoms.  History reviewed. No pertinent past medical history. History reviewed. No pertinent past surgical history. Family History  Problem Relation Age of Onset  . Depression Mother   . Asthma Mother   . Asthma Maternal Aunt   . Heart disease Maternal Grandfather   . Depression Paternal Grandmother    History  Substance Use Topics  . Smoking status: Former Smoker -- 0.15 packs/day    Types: Cigarettes  . Smokeless tobacco: Not on file  . Alcohol Use: No   OB History    No data available     Review of Systems  Respiratory: Positive for cough and wheezing.   All other systems reviewed and are negative.  Allergies  Review of patient's allergies indicates no known allergies.  Home Medications   Prior to Admission medications   Medication Sig Start Date End Date Taking? Authorizing Provider  methocarbamol (ROBAXIN) 500 MG tablet Take 2 tablets (1,000 mg total) by mouth 4 (four) times daily as needed (Pain). 03/31/14   Nicole Pisciotta, PA-C  metroNIDAZOLE (FLAGYL) 500 MG tablet Take 1 tablet (500  mg total) by mouth 2 (two) times daily. 02/15/14   Dorathy KinsmanVirginia Smith, CNM  ondansetron (ZOFRAN) 4 MG tablet Take 1 tablet (4 mg total) by mouth every 8 (eight) hours as needed for nausea or vomiting. 03/09/14   Trixie DredgeEmily West, PA-C   BP 105/60 mmHg  Pulse 61  Temp(Src) 98.2 F (36.8 C) (Oral)  Resp 20  SpO2 100%  LMP 04/24/2014 Physical Exam  Constitutional: She is oriented to person, place, and time. She appears well-developed and well-nourished. No distress.  HENT:  Head: Normocephalic and atraumatic.  Mouth/Throat: No oropharyngeal exudate.  Erythema, but no exudate or tonsillar edema  Eyes: Conjunctivae and EOM are normal.  Neck: Neck supple. No tracheal deviation present.  Cardiovascular: Normal rate.   Pulmonary/Chest: Effort normal. No respiratory distress. She has wheezes.  Right upper and lower wheezing noted that clears after coughing; coughing throughout exam  Abdominal: Soft. There is no tenderness.  Musculoskeletal: Normal range of motion.  Neurological: She is alert and oriented to person, place, and time. Coordination normal.  Skin: Skin is warm and dry.  Psychiatric: She has a normal mood and affect. Her behavior is normal.  Nursing note and vitals reviewed.   ED Course  Procedures  DIAGNOSTIC STUDIES: Oxygen Saturation is 100% on RA, normal by my interpretation.    COORDINATION OF CARE: 10:03 PM Discussed treatment plan with pt. She agreed to plan.  Labs Review Labs Reviewed - No data to display  Imaging Review No results found.   EKG  Interpretation None      MDM   Final diagnoses:  Cough    Chest xray unremarkable for acute changes. Patient will have hycodan for cough. Patient is PERC negative. Vitals stable and patient afebrile.   I personally performed the services described in this documentation, which was scribed in my presence. The recorded information has been reviewed and is accurate.    Emilia Beck, PA-C 05/21/14 0612  Richardean Canal, MD 05/22/14 2255

## 2014-05-14 NOTE — Discharge Instructions (Signed)
Use inhaler as needed for wheezing and cough. Take Hycodan as needed for cough. Refer to attached documents for more information. Return to the ED with worsening or concerning symptoms.

## 2014-08-13 ENCOUNTER — Emergency Department (HOSPITAL_COMMUNITY)
Admission: EM | Admit: 2014-08-13 | Discharge: 2014-08-13 | Disposition: A | Discharge status: Home / Self Care | Payer: Self-pay | Attending: Emergency Medicine | Admitting: Emergency Medicine

## 2014-08-13 ENCOUNTER — Encounter (HOSPITAL_COMMUNITY): Payer: Self-pay

## 2014-08-13 DIAGNOSIS — F1210 Cannabis abuse, uncomplicated: Secondary | ICD-10-CM | POA: Insufficient documentation

## 2014-08-13 DIAGNOSIS — Z87891 Personal history of nicotine dependence: Secondary | ICD-10-CM | POA: Insufficient documentation

## 2014-08-13 DIAGNOSIS — F1410 Cocaine abuse, uncomplicated: Secondary | ICD-10-CM | POA: Insufficient documentation

## 2014-08-13 DIAGNOSIS — F1910 Other psychoactive substance abuse, uncomplicated: Secondary | ICD-10-CM

## 2014-08-13 DIAGNOSIS — F1994 Other psychoactive substance use, unspecified with psychoactive substance-induced mood disorder: Secondary | ICD-10-CM

## 2014-08-13 DIAGNOSIS — F1310 Sedative, hypnotic or anxiolytic abuse, uncomplicated: Secondary | ICD-10-CM | POA: Insufficient documentation

## 2014-08-13 HISTORY — DX: Other psychoactive substance use, unspecified with psychoactive substance-induced mood disorder: F19.94

## 2014-08-13 LAB — COMPREHENSIVE METABOLIC PANEL
ALT: 16 U/L (ref 14–54)
AST: 23 U/L (ref 15–41)
Albumin: 4.6 g/dL (ref 3.5–5.0)
Alkaline Phosphatase: 57 U/L (ref 38–126)
Anion gap: 8 (ref 5–15)
BUN: 12 mg/dL (ref 6–20)
CO2: 25 mmol/L (ref 22–32)
Calcium: 10 mg/dL (ref 8.9–10.3)
Chloride: 105 mmol/L (ref 101–111)
Creatinine, Ser: 0.9 mg/dL (ref 0.44–1.00)
GFR calc Af Amer: >60 mL/min (ref >60)
GFR calc non Af Amer: >60 mL/min (ref >60)
Glucose, Bld: 80 mg/dL (ref 65–99)
Potassium: 4 mmol/L (ref 3.5–5.1)
Sodium: 138 mmol/L (ref 135–145)
Total Bilirubin: 1 mg/dL (ref 0.3–1.2)
Total Protein: 8.3 g/dL — ABNORMAL HIGH (ref 6.5–8.1)

## 2014-08-13 LAB — CBC
HCT: 38.9 % (ref 36.0–46.0)
Hemoglobin: 13.3 g/dL (ref 12.0–15.0)
MCH: 31.9 pg (ref 26.0–34.0)
MCHC: 34.2 g/dL (ref 30.0–36.0)
MCV: 93.3 fL (ref 78.0–100.0)
Platelets: 263 10*3/uL (ref 150–400)
RBC: 4.17 MIL/uL (ref 3.87–5.11)
RDW: 12.8 % (ref 11.5–15.5)
WBC: 5.7 10*3/uL (ref 4.0–10.5)

## 2014-08-13 LAB — RAPID URINE DRUG SCREEN, HOSP PERFORMED
Amphetamines: NOT DETECTED
Barbiturates: NOT DETECTED
Benzodiazepines: POSITIVE — AB
Cocaine: POSITIVE — AB
Opiates: NOT DETECTED
Tetrahydrocannabinol: POSITIVE — AB

## 2014-08-13 LAB — SALICYLATE LEVEL: Salicylate Lvl: <4 mg/dL (ref 2.8–30.0)

## 2014-08-13 LAB — ACETAMINOPHEN LEVEL: Acetaminophen (Tylenol), Serum: <10 ug/mL — ABNORMAL LOW (ref 10–30)

## 2014-08-13 LAB — ETHANOL: Alcohol, Ethyl (B): <5 mg/dL (ref <5)

## 2014-08-13 MED ORDER — IBUPROFEN 200 MG PO TABS: 600.0000 mg | ORAL_TABLET | Freq: Three times a day (TID) | ORAL | Status: DC | PRN

## 2014-08-13 MED ORDER — ONDANSETRON HCL 4 MG PO TABS: 4.0000 mg | ORAL_TABLET | Freq: Three times a day (TID) | ORAL | Status: DC | PRN

## 2014-08-13 MED ORDER — ACETAMINOPHEN 325 MG PO TABS: 650.0000 mg | ORAL_TABLET | ORAL | Status: DC | PRN

## 2014-08-13 MED ORDER — LORAZEPAM 1 MG PO TABS: 1.0000 mg | ORAL_TABLET | Freq: Three times a day (TID) | ORAL | Status: DC | PRN

## 2014-08-13 NOTE — BH Assessment (Signed)
Assessment Note  Jennifer Campbell is an 24 y.o. female. Pt presents with altered mental status. Patient's cousin dropped her off at [facility] for a medical and mental evaluation.  Pt reports she was arrested and taken to jail on Monday night in [facility]. She does not remember anything between that time and this morning when she woke up in jail. Pt reports she does not use any drugs or alcohol but says that she was told that she was arrested for a DUI. Subsequently, patient's UDS today is positive for Benzo's, THC, and Cocaine.  Pt is alert and oriented x 4 today. Pt reportedly told ED staff that she was suicidal as well. During the [facility] assessment patient denies suicidal thoughts. Sts, "I just had a bad 2-3 days, I can't remember what happened, and I was overwhelmed". Patient adamently states that she is not suicidal, homicidal, and/or experiencing any psychotic symptoms. She denies history of self harm or harm to others. She denies history of memory lapse and/or psychotic symptoms. Patient tearful during the assessment stating, "I am just trying to process what happened and how I ended up in [facility]". Patient sts that she doesn't have a prior psychiatric history, no current/past psychiatric outpatient providers, or history of inpatient psychiatric treatment.     Axis I:  Depressive Disorder Nos Axis II: Deferred Axis III: History reviewed. No pertinent past medical history. Axis IV: other psychosocial or environmental problems, problems related to social environment, problems with access to health care services and problems with primary support group Axis V: 31-40 impairment in reality testing  Past Medical History: History reviewed. No pertinent past medical history.  History reviewed. No pertinent past surgical history.  Family History:  Family History  Problem Relation Age of Onset  . Depression Mother   . Asthma Mother   . Asthma Maternal Aunt   . Heart disease Maternal  Grandfather   . Depression Paternal Grandmother     Social History:  reports that she has quit smoking. Her smoking use included Cigarettes. She smoked 0.15 packs per day. She does not have any smokeless tobacco history on file. She reports that she does not drink alcohol or use illicit drugs.  Additional Social History:  Substance #2 Name of Substance 2: UDS + for Cocaine  2 - Age of First Use: patient denies use  2 - Amount (size/oz): patient denies use 2 - Frequency: patient denies use  2 - Duration: patient denies use 2 - Last Use / Amount: patient denies use Substance #3 Name of Substance 3: UDS + for Benzo's 3 - Age of First Use: patient denies use 3 - Amount (size/oz): patient denies use 3 - Frequency: patient denies use 3 - Duration: patient denies use  3 - Last Use / Amount: patient denies use  CIWA: CIWA-Ar BP: 147/76 mmHg Pulse Rate: (!) 55 COWS:    Allergies: No Known Allergies  Home Medications:  (Not in a hospital admission)  OB/GYN Status:  Patient's last menstrual period was 07/30/2014.  General Assessment Data Location of Assessment: [facility] ED Is this a Tele or Face-to-Face Assessment?: Face-to-Face Is this an Initial Assessment or a Re-assessment for this encounter?: Initial Assessment Marital status: Single Maiden name:  (Asberry) Is patient pregnant?: No Pregnancy Status: No Living Arrangements: Other (Comment), Parent (lives with mother ) Can pt return to current living arrangement?: No Admission Status: Voluntary Is patient capable of signing voluntary admission?: Yes Referral Source: Self/Family/Friend Insurance type:  (Self Pay )  Medical Screening Exam [facility] Walk-in ONLY) Medical Exam completed: No  Crisis Care Plan Living Arrangements: Other (Comment), Parent (lives with mother ) Name of Psychiatrist:  (No psychiatrist ) Name of Therapist:  (No therapist )  Education Status Is patient currently in school?: No Current Grade:   (n/a) Highest grade of school patient has completed:  (unk) Name of school:  (n/a) Contact person:  (n/a)  Risk to self with the past 6 months Suicidal Ideation: No Has patient been a risk to self within the past 6 months prior to admission? : No Suicidal Intent: No Has patient had any suicidal intent within the past 6 months prior to admission? : No Is patient at risk for suicide?: No Suicidal Plan?: No Has patient had any suicidal plan within the past 6 months prior to admission? : No Access to Means: No What has been your use of drugs/alcohol within the last 12 months?:  (patient's UDS + for cocaine, thc, and benzo's) Previous Attempts/Gestures: No How many times?:  (0) Other Self Harm Risks:  (none reported ) Triggers for Past Attempts: Other (Comment) (n/a) Intentional Self Injurious Behavior: None Family Suicide History: Unknown Recent stressful life event(s): Other (Comment), Legal Issues (in jail 3 days for a DUI and no recollection of being arrest) Persecutory voices/beliefs?: No Depression: Yes Depression Symptoms: Feeling angry/irritable, Feeling worthless/self pity, Loss of interest in usual pleasures, Guilt, Fatigue, Isolating, Tearfulness, Insomnia, Despondent Substance abuse history and/or treatment for substance abuse?: No Suicide prevention information given to non-admitted patients: Not applicable  Risk to Others within the past 6 months Homicidal Ideation: No Does patient have any lifetime risk of violence toward others beyond the six months prior to admission? : No Thoughts of Harm to Others: No Current Homicidal Intent: No Current Homicidal Plan: No Access to Homicidal Means: No Identified Victim:  (n/a) History of harm to others?: No Assessment of Violence: None Noted Violent Behavior Description:  (patient calm and cooperative ) Does patient have access to weapons?: No Criminal Charges Pending?: No Does patient have a court date: No Is patient on  probation?: No  Psychosis Hallucinations: None noted Delusions: None noted  Mental Status Report Appearance/Hygiene: Disheveled Eye Contact: Good Motor Activity: Freedom of movement Speech: Logical/coherent Level of Consciousness: Alert Mood: Depressed Affect: Appropriate to circumstance Anxiety Level: None Thought Processes: Coherent, Relevant Judgement: Impaired Orientation: Person, Place, Time, Situation Obsessive Compulsive Thoughts/Behaviors: None  Cognitive Functioning Concentration: Decreased Memory: Recent Intact, Remote Intact IQ: Average Insight: Poor Impulse Control: Poor Appetite: Fair Weight Loss:  (none reported ) Weight Gain:  (none reported ) Sleep: Decreased Total Hours of Sleep:  (varies ) Vegetative Symptoms: None  ADLScreening [facility] Assessment Services) Patient's cognitive ability adequate to safely complete daily activities?: Yes Patient able to express need for assistance with ADLs?: No Independently performs ADLs?: Yes (appropriate for developmental age)  Prior Inpatient Therapy Prior Inpatient Therapy: No Prior Therapy Dates:  (n/a) Prior Therapy Facilty/Provider(s):  (n/a) Reason for Treatment:  (n/a)  Prior Outpatient Therapy Prior Outpatient Therapy: No Prior Therapy Dates:  (n/a) Prior Therapy Facilty/Provider(s):  (n/a) Reason for Treatment:  (n/a) Does patient have an ACCT team?: No Does patient have Intensive In-House Services?  : No Does patient have [facility] services? : No Does patient have P4CC services?: No  ADL Screening (condition at time of admission) Patient's cognitive ability adequate to safely complete daily activities?: Yes Is the patient deaf or have difficulty hearing?: No Does the patient have difficulty seeing, even when wearing  glasses/contacts?: No Does the patient have difficulty concentrating, remembering, or making decisions?: Yes Patient able to express need for assistance with ADLs?: No Does the patient  have difficulty dressing or bathing?: No Independently performs ADLs?: Yes (appropriate for developmental age) Does the patient have difficulty walking or climbing stairs?: No Weakness of Legs: None Weakness of Arms/Hands: None  Home Assistive Devices/Equipment Home Assistive Devices/Equipment: None    Abuse/Neglect Assessment (Assessment to be complete while patient is alone) Physical Abuse: Denies Verbal Abuse: Denies Sexual Abuse: Denies Exploitation of patient/patient's resources: Denies Self-Neglect: Denies Values / Beliefs Cultural Requests During Hospitalization: None Spiritual Requests During Hospitalization: None   Advance Directives (For Healthcare) Does patient have an advance directive?: No    Additional Information 1:1 In Past 12 Months?: No CIRT Risk: No Elopement Risk: No Does patient have medical clearance?: Yes     Disposition:  Disposition Initial Assessment Completed for this Encounter: Yes Disposition of Patient: Other dispositions (Pending a psychiatric evaluation by Dr. Aida Raider, NP)  On Site Evaluation by:   Reviewed with Physician:    Melynda Ripple [facility] 08/13/2014 9:43 AM

## 2014-08-13 NOTE — ED Provider Notes (Signed)
CSN: [id number]     Arrival date & time 08/13/14  0458 History   First MD Initiated Contact with Patient 08/13/14 [phone]     Chief Complaint  Patient presents with  . Suicidal  . Memory Loss     (Consider location/radiation/quality/duration/timing/severity/associated sxs/prior Treatment) HPI Jennifer Campbell is a 24 y.o. female with no medical problems, presents to emergency department complaining of memory loss and suicidal thoughts. Patient states she was driving from [place] 2 days ago, after hanging out with a friend, when suddenly she woke up in the jail this morning. She states she does not remember anything after driving. She states she was charged with driving under influence, giving false identification, and something else she cannot remember. She is in the emergency department mainly because she cannot remember what happened. She states she does not drink alcohol or does drugs. She states it is a possibility she could be restudied. She is complaining of pain in bilateral legs and hips. She admitted to the nurse was triaging her that she has suicidal thoughts. She denies this to me. She denies any homicidal ideations.=  History reviewed. No pertinent past medical history. History reviewed. No pertinent past surgical history. Family History  Problem Relation Age of Onset  . Depression Mother   . Asthma Mother   . Asthma Maternal Aunt   . Heart disease Maternal Grandfather   . Depression Paternal Grandmother    History  Substance Use Topics  . Smoking status: Former Smoker -- 0.15 packs/day    Types: Cigarettes  . Smokeless tobacco: Not on file  . Alcohol Use: No   OB History    No data available     Review of Systems  Constitutional: Negative for fever and chills.  Respiratory: Negative for cough, chest tightness and shortness of breath.   Cardiovascular: Negative for chest pain, palpitations and leg swelling.  Gastrointestinal: Negative for nausea, vomiting,  abdominal pain and diarrhea.  Genitourinary: Negative for dysuria, flank pain, vaginal bleeding, vaginal discharge, vaginal pain and pelvic pain.  Musculoskeletal: Positive for arthralgias. Negative for myalgias, neck pain and neck stiffness.  Skin: Negative for rash.  Neurological: Negative for dizziness, weakness and headaches.  Psychiatric/Behavioral: Positive for suicidal ideas.  All other systems reviewed and are negative.     Allergies  Review of patient's allergies indicates no known allergies.  Home Medications   Prior to Admission medications   Medication Sig Start Date End Date Taking? Authorizing Provider  HYDROcodone-homatropine (HYCODAN) 5-1.5 MG/5ML syrup Take 5 mLs by mouth every 6 (six) hours as needed for cough. Patient not taking: Reported on 08/13/2014 05/14/14   Emilia BeckKaitlyn Szekalski, PA-C  methocarbamol (ROBAXIN) 500 MG tablet Take 2 tablets (1,000 mg total) by mouth 4 (four) times daily as needed (Pain). Patient not taking: Reported on 08/13/2014 03/31/14   Joni ReiningNicole Pisciotta, PA-C  metroNIDAZOLE (FLAGYL) 500 MG tablet Take 1 tablet (500 mg total) by mouth 2 (two) times daily. Patient not taking: Reported on 08/13/2014 02/15/14   Dorathy KinsmanVirginia Smith, CNM  ondansetron (ZOFRAN) 4 MG tablet Take 1 tablet (4 mg total) by mouth every 8 (eight) hours as needed for nausea or vomiting. Patient not taking: Reported on 08/13/2014 03/09/14   Trixie DredgeEmily West, PA-C   BP 147/76 mmHg  Pulse 55  Temp(Src) 98.8 F (37.1 C) (Oral)  Resp 16  SpO2 100%  LMP 07/30/2014 Physical Exam  Constitutional: She is oriented to person, place, and time. She appears well-developed and well-nourished. No distress.  HENT:  Head: Normocephalic.  Eyes: Conjunctivae are normal.  Neck: Neck supple.  Cardiovascular: Normal rate, regular rhythm and normal heart sounds.   Pulmonary/Chest: Effort normal and breath sounds normal. No respiratory distress. She has no wheezes. She has no rales.  Abdominal: Soft. Bowel sounds  are normal. She exhibits no distension. There is no tenderness. There is no rebound.  Musculoskeletal: She exhibits no edema.  Dorsal pedal pulses intact and equal bilaterally. Feet are warm, pink, normal soles of the feet with no lesions or abrasions.  Neurological: She is alert and oriented to person, place, and time.  Skin: Skin is warm and dry.  Psychiatric: She has a normal mood and affect. Her behavior is normal.  Nursing note and vitals reviewed.   ED Course  Procedures (including critical care time) Labs Review Labs Reviewed  ACETAMINOPHEN LEVEL - Abnormal; Notable for the following:    Acetaminophen (Tylenol), Serum <10 (*)    All other components within normal limits  COMPREHENSIVE METABOLIC PANEL - Abnormal; Notable for the following:    Total Protein 8.3 (*)    All other components within normal limits  CBC  ETHANOL  SALICYLATE LEVEL  URINE RAPID DRUG SCREEN (HOSP PERFORMED) NOT AT [facility]  POC URINE PREG, ED    Imaging Review No results found.   EKG Interpretation None      MDM   Final diagnoses:  Polysubstance abuse    Patient seen and examined, she is calm and cooperative at this time. Here for episode of amnesia after driving from [place], and waking up in jail this morning. She has no recollection of the last 36 hours. She was discharged apparently with DUI and false identification. Patient admitted to the nurse who was triaging her that she will had suicidal thoughts. Patient denies SI or HI to me. Will get TTS assessment done, labs and urinalysis pending.   Pt medically cleared. Pt's drug screen positive for cocaine, benzos, marijuana. Pt again denies doing any drugs. Disposition per psychiatry.   Filed Vitals:   08/13/14 0504 08/13/14 1159  BP: 147/76   Pulse: 55 60  Temp: 98.8 F (37.1 C) 98.3 F (36.8 C)  TempSrc: Oral Oral  Resp: 16 16  SpO2: 100% 100%     Jaynie Crumble, PA-C 08/13/14 1644  Raeford Razor, MD 08/14/14 [phone]

## 2014-08-13 NOTE — Consult Note (Signed)
[facility] Consult   Reason for Consult:  Mood disorder, Substance induced mood disorder, Polysubstance abuse Referring Physician:  EDP Patient Identification: Jennifer Campbell MRN:  [id number] Principal Diagnosis: Substance induced mood disorder Diagnosis:   Patient Active Problem List   Diagnosis Date Noted  . Substance induced mood disorder [F19.94] 08/13/2014    Priority: High  . Chronic tension headaches [G44.229] 06/09/2011  . DUB (dysfunctional uterine bleeding) [N93.8] 06/09/2011    Total Time spent with patient: 1 hour  Subjective:   Jennifer Campbell is a 24 y.o. female patient admitted with .Mood disorder, Substance induced mood disorder, Polysubstance abuse  HPI: AA female, 24 years old was evaluated for substance abuse( Cocaine, Benzos and Marijuana) and driving with a revoked License and under the influence.   Patient was admitted directly from jail where she spent 3 days.   Patient reported that she was caught driving with a revoked License and that her License was revoked because she did not pay for a ticket.  Patient left her mother house where she was staying and ended up in jail.  Patient denied using any illicit substance but her UDS is positive for Cocaine, Marijuana and Benzodiazepines.    Patient denies ever seeing a provider, Psychiatrist and PMD, she does not take medications either.  Patient denies any previous hospitalizations.  Patient denies SI/HI/AVH and denies previous attempt.  She reports good sleep and appetite.  Patient is unemployed and stated that she quit her job but did not give reasons for quitting her job.   Patient is discharged back home and is given resources for [facility] care.  HPI Elements:   Location:  Unspecified mood disorder, Substance induced mood disorder, Polysubstance abuse. Quality:  severe. Severity:  severe. Timing:  Acute. Duration:  Sudden, 3 days ago. Context:  Brought in by family member for [facility]  evaluation..  Past Medical History: History reviewed. No pertinent past medical history. History reviewed. No pertinent past surgical history. Family History:  Family History  Problem Relation Age of Onset  . Depression Mother   . Asthma Mother   . Asthma Maternal Aunt   . Heart disease Maternal Grandfather   . Depression Paternal Grandmother    Social History:  History  Alcohol Use No     History  Drug Use No    Comment: THC    History   Social History  . Marital Status: Single    Spouse Name: N/A  . Number of Children: N/A  . Years of Education: N/A   Social History Main Topics  . Smoking status: Former Smoker -- 0.15 packs/day    Types: Cigarettes  . Smokeless tobacco: Not on file  . Alcohol Use: No  . Drug Use: No     Comment: THC  . Sexual Activity: Yes   Other Topics Concern  . None   Social History Narrative   Additional Social History:        Name of Substance 2: UDS + for Cocaine  2 - Age of First Use: patient denies use  2 - Amount (size/oz): patient denies use 2 - Frequency: patient denies use  2 - Duration: patient denies use 2 - Last Use / Amount: patient denies use Name of Substance 3: UDS + for Benzo's 3 - Age of First Use: patient denies use 3 - Amount (size/oz): patient denies use 3 - Frequency: patient denies use 3 - Duration: patient denies use  3 - Last Use / Amount: patient  denies use               Allergies:  No Known Allergies  Labs:  Results for orders placed or performed during the hospital encounter of 08/13/14 (from the past 48 hour(s))  Acetaminophen level     Status: Abnormal   Collection Time: 08/13/14  5:38 AM  Result Value Ref Range   Acetaminophen (Tylenol), Serum <10 (L) 10 - 30 ug/mL    Comment:        THERAPEUTIC CONCENTRATIONS VARY SIGNIFICANTLY. A RANGE OF 10-30 ug/mL MAY BE AN EFFECTIVE CONCENTRATION FOR MANY PATIENTS. HOWEVER, SOME ARE BEST TREATED AT CONCENTRATIONS OUTSIDE  THIS RANGE. ACETAMINOPHEN CONCENTRATIONS >150 ug/mL AT 4 HOURS AFTER INGESTION AND >50 ug/mL AT 12 HOURS AFTER INGESTION ARE OFTEN ASSOCIATED WITH TOXIC REACTIONS.   CBC     Status: None   Collection Time: 08/13/14  5:38 AM  Result Value Ref Range   WBC 5.7 4.0 - 10.5 K/uL   RBC 4.17 3.87 - 5.11 MIL/uL   Hemoglobin 13.3 12.0 - 15.0 g/dL   HCT 38.9 36.0 - 46.0 %   MCV 93.3 78.0 - 100.0 fL   MCH 31.9 26.0 - 34.0 pg   MCHC 34.2 30.0 - 36.0 g/dL   RDW 12.8 11.5 - 15.5 %   Platelets 263 150 - 400 K/uL  Comprehensive metabolic panel     Status: Abnormal   Collection Time: 08/13/14  5:38 AM  Result Value Ref Range   Sodium 138 135 - 145 mmol/L   Potassium 4.0 3.5 - 5.1 mmol/L   Chloride 105 101 - 111 mmol/L   CO2 25 22 - 32 mmol/L   Glucose, Bld 80 65 - 99 mg/dL   BUN 12 6 - 20 mg/dL   Creatinine, Ser 0.90 0.44 - 1.00 mg/dL   Calcium 10.0 8.9 - 10.3 mg/dL   Total Protein 8.3 (H) 6.5 - 8.1 g/dL   Albumin 4.6 3.5 - 5.0 g/dL   AST 23 15 - 41 U/L   ALT 16 14 - 54 U/L   Alkaline Phosphatase 57 38 - 126 U/L   Total Bilirubin 1.0 0.3 - 1.2 mg/dL   GFR calc non Af Amer >60 >60 mL/min   GFR calc Af Amer >60 >60 mL/min    Comment: (NOTE) The eGFR has been calculated using the CKD EPI equation. This calculation has not been validated in all clinical situations. eGFR's persistently <60 mL/min signify possible Chronic Kidney Disease.    Anion gap 8 5 - 15  Ethanol (ETOH)     Status: None   Collection Time: 08/13/14  5:38 AM  Result Value Ref Range   Alcohol, Ethyl (B) <5 <5 mg/dL    Comment:        LOWEST DETECTABLE LIMIT FOR SERUM ALCOHOL IS 11 mg/dL FOR MEDICAL PURPOSES ONLY   Salicylate level     Status: None   Collection Time: 08/13/14  5:38 AM  Result Value Ref Range   Salicylate Lvl <8.5 2.8 - 30.0 mg/dL  Urine Drug Screen     Status: Abnormal   Collection Time: 08/13/14  8:35 AM  Result Value Ref Range   Opiates NONE DETECTED NONE DETECTED   Cocaine POSITIVE (A)  NONE DETECTED   Benzodiazepines POSITIVE (A) NONE DETECTED   Amphetamines NONE DETECTED NONE DETECTED   Tetrahydrocannabinol POSITIVE (A) NONE DETECTED   Barbiturates NONE DETECTED NONE DETECTED    Comment:        DRUG SCREEN FOR MEDICAL  PURPOSES ONLY.  IF CONFIRMATION IS NEEDED FOR ANY PURPOSE, NOTIFY LAB WITHIN 5 DAYS.        LOWEST DETECTABLE LIMITS FOR URINE DRUG SCREEN Drug Class       Cutoff (ng/mL) Amphetamine      1000 Barbiturate      200 Benzodiazepine   698 Tricyclics       614 Opiates          300 Cocaine          300 THC              50     Vitals: Blood pressure 147/76, pulse 55, temperature 98.8 F (37.1 C), temperature source Oral, resp. rate 16, last menstrual period 07/30/2014, SpO2 100 %.  Risk to Self: Suicidal Ideation: No Suicidal Intent: No Is patient at risk for suicide?: No Suicidal Plan?: No Access to Means: No What has been your use of drugs/alcohol within the last 12 months?:  (patient's UDS + for cocaine, thc, and benzo's) How many times?:  (0) Other Self Harm Risks:  (none reported ) Triggers for Past Attempts: Other (Comment) (n/a) Intentional Self Injurious Behavior: None Risk to Others: Homicidal Ideation: No Thoughts of Harm to Others: No Current Homicidal Intent: No Current Homicidal Plan: No Access to Homicidal Means: No Identified Victim:  (n/a) History of harm to others?: No Assessment of Violence: None Noted Violent Behavior Description:  (patient calm and cooperative ) Does patient have access to weapons?: No Criminal Charges Pending?: No Does patient have a court date: No Prior Inpatient Therapy: Prior Inpatient Therapy: No Prior Therapy Dates:  (n/a) Prior Therapy Facilty/Provider(s):  (n/a) Reason for Treatment:  (n/a) Prior Outpatient Therapy: Prior Outpatient Therapy: No Prior Therapy Dates:  (n/a) Prior Therapy Facilty/Provider(s):  (n/a) Reason for Treatment:  (n/a) Does patient have an ACCT team?: No Does  patient have Intensive In-House Services?  : No Does patient have [facility] services? : No Does patient have [facility] services?: No  Current Facility-Administered Medications  Medication Dose Route Frequency Provider Last Rate Last Dose  . acetaminophen (TYLENOL) tablet 650 mg  650 mg Oral Q4H PRN Tatyana Kirichenko, PA-C      . ibuprofen (ADVIL,MOTRIN) tablet 600 mg  600 mg Oral Q8H PRN Tatyana Kirichenko, PA-C      . LORazepam (ATIVAN) tablet 1 mg  1 mg Oral Q8H PRN Tatyana Kirichenko, PA-C      . ondansetron (ZOFRAN) tablet 4 mg  4 mg Oral Q8H PRN Jeannett Senior, PA-C       Current Outpatient Prescriptions  Medication Sig Dispense Refill  . HYDROcodone-homatropine (HYCODAN) 5-1.5 MG/5ML syrup Take 5 mLs by mouth every 6 (six) hours as needed for cough. (Patient not taking: Reported on 08/13/2014) 120 mL 0  . methocarbamol (ROBAXIN) 500 MG tablet Take 2 tablets (1,000 mg total) by mouth 4 (four) times daily as needed (Pain). (Patient not taking: Reported on 08/13/2014) 20 tablet 0  . metroNIDAZOLE (FLAGYL) 500 MG tablet Take 1 tablet (500 mg total) by mouth 2 (two) times daily. (Patient not taking: Reported on 08/13/2014) 14 tablet 0  . ondansetron (ZOFRAN) 4 MG tablet Take 1 tablet (4 mg total) by mouth every 8 (eight) hours as needed for nausea or vomiting. (Patient not taking: Reported on 08/13/2014) 20 tablet 0    Musculoskeletal: Strength & Muscle Tone: within normal limits Gait & Station: normal Patient leans: N/A  Psychiatric Specialty Exam: Physical Exam  Review of Systems  Constitutional: Negative.  HENT: Negative.   Eyes: Negative.   Respiratory: Negative.   Cardiovascular: Negative.   Gastrointestinal: Negative.   Genitourinary: Negative.   Musculoskeletal: Negative.   Skin: Negative.   Neurological: Negative.   Endo/Heme/Allergies: Negative.     Blood pressure 147/76, pulse 55, temperature 98.8 F (37.1 C), temperature source Oral, resp. rate 16, last menstrual period  07/30/2014, SpO2 100 %.There is no weight on file to calculate BMI.  General Appearance: Casual  Eye Contact::  Good  Speech:  Clear and Coherent and Normal Rate  Volume:  Normal  Mood:  Euthymic  Affect:  Congruent  Thought Process:  Coherent, Goal Directed and Intact  Orientation:  Full (Time, Place, and Person)  Thought Content:  WDL  Suicidal Thoughts:  No  Homicidal Thoughts:  No  Memory:  Immediate;   Good Recent;   Good Remote;   Good  Judgement:  Fair  Insight:  Fair  Psychomotor Activity:  Normal  Concentration:  Good  Recall:  NA  Fund of Knowledge:Good  Language: Good  Akathisia:  NA  Handed:  Right  AIMS (if indicated):     Assets:  Desire for Improvement  ADL's:  Intact  Cognition: WNL  Sleep:      Medical Decision Making: Established Problem, Stable/Improving (1)  Disposition: Discharge home  Delfin Gant   PMHNP-BC 08/13/2014 11:46 AM Patient seen face-to-face for psychiatric evaluation, chart reviewed and case discussed with the physician extender and developed treatment plan. Reviewed the information documented and agree with the treatment plan. Corena Pilgrim, MD

## 2014-08-13 NOTE — [facility] Suicide Risk Assessment (Cosign Needed)
Suicide Risk Assessment  Discharge Assessment   [facility]   Demographic Factors:  Low socioeconomic status and Unemployed  Total Time spent with patient: 20 minutes  Musculoskeletal: Strength & Muscle Tone: within normal limits Gait & Station: normal Patient leans: N/A  Psychiatric Specialty Exam:     Blood pressure 147/76, pulse 55, temperature 98.8 F (37.1 Campbell), temperature source Oral, resp. rate 16, last menstrual period 07/30/2014, SpO2 100 %.There is no weight on file to calculate BMI.  General Appearance: Casual and Fairly Groomed  Eye Contact::  Good  Speech:  Clear and Coherent and Normal Rate409  Volume:  Normal  Mood:  Euthymic  Affect:  Congruent  Thought Process:  Coherent, Goal Directed and Intact  Orientation:  Full (Time, Place, and Person)  Thought Content:  WDL  Suicidal Thoughts:  No  Homicidal Thoughts:  No  Memory:  Immediate;   Good Recent;   Good Remote;   Good  Judgement:  Fair  Insight:  Fair  Psychomotor Activity:  Normal  Concentration:  Good  Recall:  NA  Fund of Knowledge:Fair  Language: Good  Akathisia:  NA  Handed:  Right  AIMS (if indicated):     Assets:  Desire for Improvement  Sleep:     Cognition: WNL  ADL's:  Intact      Has this patient used any form of tobacco in the last 30 days? (Cigarettes, Smokeless Tobacco, Cigars, and/or Pipes) Yes, A prescription for an FDA-approved tobacco cessation medication was offered at discharge and the patient refused  Mental Status Per Nursing Assessment::   On Admission:     Current Mental Status by Physician: NA  Loss Factors: NA  Historical Factors: NA  Risk Reduction Factors:   Religious beliefs about death and Living with another person, especially a relative  Continued Clinical Symptoms:  Alcohol/Substance Abuse/Dependencies  Cognitive Features That Contribute To Risk:  Closed-mindedness and Polarized thinking    Suicide Risk:  Minimal: No  identifiable suicidal ideation.  Patients presenting with no risk factors but with morbid ruminations; may be classified as minimal risk based on the severity of the depressive symptoms  Principal Problem: <principal problem not specified> Discharge Diagnoses:  Patient Active Problem List   Diagnosis Date Noted  . Chronic tension headaches [G44.229] 06/09/2011  . DUB (dysfunctional uterine bleeding) [N93.8] 06/09/2011      Plan Of Care/Follow-up recommendations:  Activity:  As tolerated Diet:  regular  Is patient on multiple antipsychotic therapies at discharge:  No   Has Patient had three or more failed trials of antipsychotic monotherapy by history:  No  Recommended Plan for Multiple Antipsychotic Therapies: NA    Jennifer Campbell   PMHNP-BC 08/13/2014, 11:36 AM

## 2014-08-13 NOTE — Discharge Instructions (Signed)
For your ongoing mental health needs, you are advised to follow up with [facility].  New and returning patients are seen at their walk-in clinic.  Walk-in hours are Monday - Friday from 8:00 am - 3:00 pm.  Walk-in patients are seen on a first come, first served basis.  Try to arrive as early as possible for he best chance of being seen the same day: ° °     [facility] °     [address] °     [place], [place] [address] °     [phone] ° °To help you maintain a sober lifestyle, a substance abuse treatment program may be beneficial to you.  Contact [facility] at your earliest opportunity to enroll in their program: ° °     [facility] °     [address] °     [place], [place] [address] °     [phone] °     New patients are seen at the walk-in clinic every Tuesday from 9:00 am - 12:00 pm. °

## 2014-08-13 NOTE — ED Notes (Signed)
Call to ride for d/c pick-up.

## 2014-08-13 NOTE — ED Notes (Addendum)
Pt presents with c/o altered mental status. Pt [place] and she does not remember anything between that time and this morning when she woke up in jail. Pt reports she does not use any drugs or alcohol but says that she was told that she was arrested for a DUI. Pt is alert and oriented x 4. Pt reports that she is suicidal as well. When explained the process to pt about medical clearance and no visitors she became upset and reported that she was not actually wanting to kill herself.

## 2014-08-13 NOTE — ED Notes (Signed)
Pt and belongings have been wanded by security.  

## 2014-08-13 NOTE — BH Assessment (Signed)
[facility] Assessment Progress Note  Per Thedore MinsMojeed Akintayo, MD this pt does not require psychiatric hospitalization at this time.  [facility] information for outpatient mental health and substance abuse treatment providers.  [facility] have been included in her discharge instructions.  Pt's nurse has been notified.  Doylene Canninghomas Skyeler Scalese, MA Triage Specialist [phone]

## 2014-12-06 ENCOUNTER — Encounter (HOSPITAL_COMMUNITY): Payer: Self-pay | Admitting: Emergency Medicine

## 2014-12-06 ENCOUNTER — Emergency Department (HOSPITAL_COMMUNITY)
Admission: EM | Admit: 2014-12-06 | Discharge: 2014-12-06 | Disposition: A | Discharge status: Home / Self Care | Payer: Medicaid Other | Attending: Emergency Medicine | Admitting: Emergency Medicine

## 2014-12-06 DIAGNOSIS — J069 Acute upper respiratory infection, unspecified: Secondary | ICD-10-CM | POA: Insufficient documentation

## 2014-12-06 DIAGNOSIS — Z791 Long term (current) use of non-steroidal anti-inflammatories (NSAID): Secondary | ICD-10-CM | POA: Insufficient documentation

## 2014-12-06 DIAGNOSIS — H7493 Unspecified disorder of middle ear and mastoid, bilateral: Secondary | ICD-10-CM | POA: Insufficient documentation

## 2014-12-06 DIAGNOSIS — J40 Bronchitis, not specified as acute or chronic: Secondary | ICD-10-CM

## 2014-12-06 DIAGNOSIS — Z87891 Personal history of nicotine dependence: Secondary | ICD-10-CM | POA: Insufficient documentation

## 2014-12-06 DIAGNOSIS — Z79899 Other long term (current) drug therapy: Secondary | ICD-10-CM | POA: Insufficient documentation

## 2014-12-06 DIAGNOSIS — R0789 Other chest pain: Secondary | ICD-10-CM | POA: Insufficient documentation

## 2014-12-06 DIAGNOSIS — Z7952 Long term (current) use of systemic steroids: Secondary | ICD-10-CM | POA: Insufficient documentation

## 2014-12-06 DIAGNOSIS — H578 Other specified disorders of eye and adnexa: Secondary | ICD-10-CM | POA: Insufficient documentation

## 2014-12-06 DIAGNOSIS — J209 Acute bronchitis, unspecified: Secondary | ICD-10-CM | POA: Insufficient documentation

## 2014-12-06 MED ORDER — CETIRIZINE HCL 10 MG PO TABS: 10.0000 mg | ORAL_TABLET | Freq: Every day | ORAL | Status: DC

## 2014-12-06 MED ORDER — ALBUTEROL SULFATE HFA 108 (90 BASE) MCG/ACT IN AERS: 2.0000 | INHALATION_SPRAY | RESPIRATORY_TRACT | Status: DC | PRN

## 2014-12-06 MED ORDER — NAPROXEN 250 MG PO TABS: 250.0000 mg | ORAL_TABLET | Freq: Two times a day (BID) | ORAL | Status: DC

## 2014-12-06 MED ORDER — IPRATROPIUM-ALBUTEROL 0.5-2.5 (3) MG/3ML IN SOLN
3.0000 mL | Freq: Once | RESPIRATORY_TRACT | Status: AC
  Administered 2014-12-06: 3 mL via RESPIRATORY_TRACT
  Filled 2014-12-06: qty 3

## 2014-12-06 MED ORDER — DM-GUAIFENESIN ER 30-600 MG PO TB12: 1.0000 | ORAL_TABLET | Freq: Two times a day (BID) | ORAL | Status: DC

## 2014-12-06 MED ORDER — PREDNISONE 20 MG PO TABS: 40.0000 mg | ORAL_TABLET | Freq: Every day | ORAL | Status: DC

## 2014-12-06 MED ORDER — ALBUTEROL SULFATE HFA 108 (90 BASE) MCG/ACT IN AERS
2.0000 | INHALATION_SPRAY | Freq: Once | RESPIRATORY_TRACT | Status: AC
  Administered 2014-12-06: 2 via RESPIRATORY_TRACT
  Filled 2014-12-06: qty 6.7

## 2014-12-06 NOTE — ED Notes (Signed)
Declined W/C at D/C and was escorted to lobby by RN. 

## 2014-12-06 NOTE — ED Notes (Signed)
Pt reports taking n-quil and day-quil

## 2014-12-06 NOTE — Discharge Instructions (Signed)
You were evaluated in the ED today there is not appear to be an emergent cause her symptoms at this time. Please take the medications previously prescribed to you at your ED visit earlier today. Return to ED for worsening symptoms.

## 2014-12-06 NOTE — ED Provider Notes (Signed)
CSN: [id number]     Arrival date & time 12/06/14  1339 History  This chart was scribed for non-physician practitioner, Will Marijo File, PA-C, working with Pricilla Loveless, MD, by Ronney Lion, ED Scribe. This patient was seen in room TR05C/TR05C and the patient's care was started at 2:30 PM.    Chief Complaint  Patient presents with  . URI   The history is provided by the patient. No language interpreter was used.    HPI Comments: Jennifer Campbell is a 24 y.o. female who presents to the Emergency Department complaining of URI symptoms that began 2 weeks ago and a constant, moderate cough that began 1 week ago. She notes associated nasal congestion, rhinorrhea, postnasal drip, sore throat, itchy and watery eyes, headache, and subjective fever that all onset 2 weeks ago. She also endorses associated wheezing, and chest tightness. She states her cough is worse at night. Patient has been taking Dayquil during the day and Nyquil at night, although she has not taken anything today, with minimal relief to her symptoms. She has not taken any medications today. Patient notes a history of bronchitis and has used an inhaler in the past, although she has not used one recently. She denies a history of smoking. She denies abdominal pain, nausea, vomiting, rash, double vision, ear discharge.   History reviewed. No pertinent past medical history. History reviewed. No pertinent past surgical history. Family History  Problem Relation Age of Onset  . Depression Mother   . Asthma Mother   . Asthma Maternal Aunt   . Heart disease Maternal Grandfather   . Depression Paternal Grandmother    Social History  Substance Use Topics  . Smoking status: Former Smoker -- 0.15 packs/day    Types: Cigarettes  . Smokeless tobacco: None  . Alcohol Use: No   OB History    No data available     Review of Systems  Constitutional: Positive for fever (subjective). Negative for appetite change.  HENT: Positive for  congestion, postnasal drip, rhinorrhea and sore throat. Negative for ear discharge, sinus pressure and trouble swallowing.   Eyes: Positive for discharge (watery) and itching. Negative for visual disturbance.  Respiratory: Positive for cough, chest tightness, shortness of breath and wheezing.   Gastrointestinal: Negative for nausea, vomiting and abdominal pain.  Musculoskeletal: Negative for myalgias and arthralgias.  Skin: Negative for rash.  Neurological: Positive for headaches. Negative for dizziness, syncope and light-headedness.   Allergies  Review of patient's allergies indicates no known allergies.  Home Medications   Prior to Admission medications   Not on File   BP 118/66 mmHg  Pulse 61  Temp(Src) 98.4 F (36.9 C) (Oral)  Resp 16  Ht  (1.626 m)  Wt 130 lb (58.968 kg)  BMI 22.30 kg/m2  SpO2 100%  LMP 11/11/2014 Physical Exam  Constitutional: She appears well-developed and well-nourished. No distress.  Nontoxic appearing.  HENT:  Head: Normocephalic and atraumatic.  Right Ear: External ear normal. Tympanic membrane is not erythematous. A middle ear effusion is present.  Left Ear: External ear normal. Tympanic membrane is not erythematous. A middle ear effusion is present.  Mouth/Throat: Posterior oropharyngeal erythema present. No oropharyngeal exudate or posterior oropharyngeal edema.  No TM erythema or loss of landmarks. Mild mid-ear effusion bilaterally.  Boggy nasal turbinates bilaterally.  No tonsillar exudates or hypertrophy. Mild postpharyngeal erythema, without edema.  No frontal or maxillary sinus tenderness.   Eyes: Conjunctivae are normal. Pupils are equal, round, and reactive to light. Right  eye exhibits no discharge. Left eye exhibits no discharge.  Neck: Normal range of motion. Neck supple.  No cervical lymphadenopathy.  Cardiovascular: Normal rate, regular rhythm, normal heart sounds and intact distal pulses.   Pulmonary/Chest: Effort normal. No  respiratory distress. She has no wheezes. She has no rales.  Slightly diminished in bilateral bases. Lungs otherwise clear.   Abdominal: Soft. There is no tenderness. There is no guarding.  Musculoskeletal: She exhibits no edema or tenderness.  No lower extremity edema or tenderness.  Lymphadenopathy:    She has no cervical adenopathy.  Neurological: She is alert. Coordination normal.  Skin: Skin is warm and dry. No rash noted. She is not diaphoretic.  Psychiatric: She has a normal mood and affect. Her behavior is normal.  Nursing note and vitals reviewed.   ED Course  Procedures (including critical care time)  DIAGNOSTIC STUDIES: Oxygen Saturation is 100% on RA, normal by my interpretation.    COORDINATION OF CARE: 2:33 PM - Discussed treatment plan with pt at bedside which includes nebulizer breathing treatment. Will then recheck and send home with inhaler. Pt verbalized understanding and agreed to plan.   MDM   Final diagnoses:  URI (upper respiratory infection)  Acute bronchitis, unspecified organism   This  is a 24 y.o. female who presents to the Emergency Department complaining of URI symptoms that began 2 weeks ago and a constant, moderate cough that began 1 week ago. She notes associated nasal congestion, rhinorrhea, postnasal drip, sore throat, itchy and watery eyes, headache, and subjective fever that all onset 2 weeks ago.   On exam the patient is afebrile and nontoxic appearing. The patient's lung sounds are diminished mildly bilaterally without wheezes or rhonchi. She is not tachypneic, tachycardic or hypoxic. The patient would like a breathing treatment. Will do breathing treatment and reevaluate.  The patient reports she feels better after breathing treatment. Patient has history and exam consistent with acute bronchitis. Will discharge with an albuterol inhaler and prescriptions for Mucinex DM, Zyrtec, naproxen and prednisone. I advised the patient to follow-up with  their primary care provider this week. I advised the patient to return to the emergency department with new or worsening symptoms or new concerns. The patient verbalized understanding and agreement with plan.    I personally performed the services described in this documentation, which was scribed in my presence. The recorded information has been reviewed and is accurate.    Everlene Farrier, PA-C 12/08/14 1641  Pricilla Loveless, MD 12/11/14 [phone]

## 2014-12-06 NOTE — ED Provider Notes (Signed)
CSN: [id number]   Arrival date & time 12/06/14 4098  History  This chart was scribed for non-physician practitioner, Joycie Peek, PA-C working with Derwood Kaplan, MD by Bethel Born, ED Scribe. This patient was seen in room WTR5/WTR5 and the patient's care was started at 7:16 PM.  Chief Complaint  Patient presents with  . Bronchitis    HPI The history is provided by the patient. No language interpreter was used.   Jennifer Campbell is a 24 y.o. female who presents to the Emergency Department complaining of chest congestion typical of a chronic bronchitis exacerbation for her with onset 2 weeks ago. Associated symptoms include dry cough and headache. The symptoms are worse at night. Nyquil and Dayquil have not relieved her symptoms. Pt denies fever and abdominal pain. She was seen at [facility] earlier today where she had a breathing treatment that helped. Her provider prescribed Mucinex and steroids but she states that Mucinex has not been effective for her in the past.   No past medical history on file.  No past surgical history on file.  Family History  Problem Relation Age of Onset  . Depression Mother   . Asthma Mother   . Asthma Maternal Aunt   . Heart disease Maternal Grandfather   . Depression Paternal Grandmother     Social History  Substance Use Topics  . Smoking status: Former Smoker -- 0.15 packs/day    Types: Cigarettes  . Smokeless tobacco: Not on file  . Alcohol Use: No     Review of Systems  Constitutional: Negative for fever and chills.  HENT: Positive for congestion.   Respiratory: Positive for cough.   Gastrointestinal: Negative for nausea, vomiting and abdominal pain.  Neurological: Positive for headaches. Negative for weakness.    Home Medications   Prior to Admission medications   Medication Sig Start Date End Date Taking? Authorizing Provider  albuterol (PROVENTIL HFA;VENTOLIN HFA) 108 (90 BASE) MCG/ACT inhaler Inhale 2 puffs into the  lungs every 4 (four) hours as needed for wheezing or shortness of breath. 12/06/14   Everlene Farrier, PA-C  cetirizine (ZYRTEC ALLERGY) 10 MG tablet Take 1 tablet (10 mg total) by mouth daily. 12/06/14   Everlene Farrier, PA-C  dextromethorphan-guaiFENesin (MUCINEX DM) 30-600 MG per 12 hr tablet Take 1 tablet by mouth 2 (two) times daily. 12/06/14   Everlene Farrier, PA-C  naproxen (NAPROSYN) 250 MG tablet Take 1 tablet (250 mg total) by mouth 2 (two) times daily with a meal. 12/06/14   Everlene Farrier, PA-C  predniSONE (DELTASONE) 20 MG tablet Take 2 tablets (40 mg total) by mouth daily. 12/06/14   Everlene Farrier, PA-C    Allergies  Review of patient's allergies indicates no known allergies.  Triage Vitals: BP 136/70 mmHg  Pulse 109  Temp(Src) 99.2 F (37.3 C) (Oral)  Resp 18  SpO2 100%  LMP 11/11/2014  Physical Exam  Constitutional: She is oriented to person, place, and time. She appears well-developed and well-nourished.  HENT:  Head: Normocephalic.  Eyes: EOM are normal.  Neck: Normal range of motion.  Cardiovascular: Normal rate and regular rhythm.   Pulmonary/Chest: Effort normal and breath sounds normal. No respiratory distress. She has no wheezes. She has no rales.  Abdominal: Soft. Bowel sounds are normal. She exhibits no distension. There is no tenderness.  Musculoskeletal: Normal range of motion.  Neurological: She is alert and oriented to person, place, and time.  Psychiatric: She has a normal mood and affect.  Nursing note and vitals  reviewed.   ED Course  Procedures  DIAGNOSTIC STUDIES: Oxygen Saturation is 100% on RA,  normal by my interpretation.    COORDINATION OF CARE: 7:21 PM Discussed treatment plan with pt at bedside and pt agreed to the plan.  Labs Review- Labs Reviewed - No data to display  Imaging Review No results found.  MDM  Patient presents for evaluation of bronchitis. Patient was seen earlier today at [facility] and wanted to come to the ED today for  different prescriptions. Discussed the patient trying therapy originally prescribed and to return to ED or follow-up with PCP in one week if medications are not helping. Patient agrees with this plan. Patient otherwise appears well, normal vital signs (heart rate mid 80s on my exam, no tachycardia), afebrile, benign lung exam. Again, no tachycardia, hypoxia or anything to suggest PE or other acute cardiopulmonary pathology. Patient stable, in good condition and appropriate for discharge. Final diagnoses:  Bronchitis    I personally performed the services described in this documentation, which was scribed in my presence. The recorded information has been reviewed and is accurate.      Joycie Peek, PA-C 12/06/14 2049  Derwood Kaplan, MD 12/15/14 [phone]

## 2014-12-06 NOTE — Discharge Instructions (Signed)
Acute Bronchitis Bronchitis is inflammation of the airways that extend from the windpipe into the lungs (bronchi). The inflammation often causes mucus to develop. This leads to a cough, which is the most common symptom of bronchitis.  In acute bronchitis, the condition usually develops suddenly and goes away over time, usually in a couple weeks. Smoking, allergies, and asthma can make bronchitis worse. Repeated episodes of bronchitis may cause further lung problems.  CAUSES Acute bronchitis is most often caused by the same virus that causes a cold. The virus can spread from person to person (contagious) through coughing, sneezing, and touching contaminated objects. SIGNS AND SYMPTOMS   Cough.   Fever.   Coughing up mucus.   Body aches.   Chest congestion.   Chills.   Shortness of breath.   Sore throat.  DIAGNOSIS  Acute bronchitis is usually diagnosed through a physical exam. Your health care provider will also ask you questions about your medical history. Tests, such as chest X-rays, are sometimes done to rule out other conditions.  TREATMENT  Acute bronchitis usually goes away in a couple weeks. Oftentimes, no medical treatment is necessary. Medicines are sometimes given for relief of fever or cough. Antibiotic medicines are usually not needed but may be prescribed in certain situations. In some cases, an inhaler may be recommended to help reduce shortness of breath and control the cough. A cool mist vaporizer may also be used to help thin bronchial secretions and make it easier to clear the chest.  HOME CARE INSTRUCTIONS  Get plenty of rest.   Drink enough fluids to keep your urine clear or pale yellow (unless you have a medical condition that requires fluid restriction). Increasing fluids may help thin your respiratory secretions (sputum) and reduce chest congestion, and it will prevent dehydration.   Take medicines only as directed by your health care provider.  If  you were prescribed an antibiotic medicine, finish it all even if you start to feel better.  Avoid smoking and secondhand smoke. Exposure to cigarette smoke or irritating chemicals will make bronchitis worse. If you are a smoker, consider using nicotine gum or skin patches to help control withdrawal symptoms. Quitting smoking will help your lungs heal faster.   Reduce the chances of another bout of acute bronchitis by washing your hands frequently, avoiding people with cold symptoms, and trying not to touch your hands to your mouth, nose, or eyes.   Keep all follow-up visits as directed by your health care provider.  SEEK MEDICAL CARE IF: Your symptoms do not improve after 1 week of treatment.  SEEK IMMEDIATE MEDICAL CARE IF:  You develop an increased fever or chills.   You have chest pain.   You have severe shortness of breath.  You have bloody sputum.   You develop dehydration.  You faint or repeatedly feel like you are going to pass out.  You develop repeated vomiting.  You develop a severe headache. MAKE SURE YOU:   Understand these instructions.  Will watch your condition.  Will get help right away if you are not doing well or get worse. Document Released: 04/07/2004 Document Revised: 07/15/2013 Document Reviewed: 08/21/2012 ExitCare Patient Information 2015 [employer]. This information is not intended to replace advice given to you by your health care provider. Make sure you discuss any questions you have with your health care provider. Upper Respiratory Infection, Adult An upper respiratory infection (URI) is also sometimes known as the common cold. The upper respiratory tract includes the nose,   sinuses, throat, trachea, and bronchi. Bronchi are the airways leading to the lungs. Most people improve within 1 week, but symptoms can last up to 2 weeks. A residual cough may last even longer.  CAUSES Many different viruses can infect the tissues lining the upper  respiratory tract. The tissues become irritated and inflamed and often become very moist. Mucus production is also common. A cold is contagious. You can easily spread the virus to others by oral contact. This includes kissing, sharing a glass, coughing, or sneezing. Touching your mouth or nose and then touching a surface, which is then touched by another person, can also spread the virus. SYMPTOMS  Symptoms typically develop 1 to 3 days after you come in contact with a cold virus. Symptoms vary from person to person. They may include:  Runny nose.  Sneezing.  Nasal congestion.  Sinus irritation.  Sore throat.  Loss of voice (laryngitis).  Cough.  Fatigue.  Muscle aches.  Loss of appetite.  Headache.  Low-grade fever. DIAGNOSIS  You might diagnose your own cold based on familiar symptoms, since most people get a cold 2 to 3 times a year. Your caregiver can confirm this based on your exam. Most importantly, your caregiver can check that your symptoms are not due to another disease such as strep throat, sinusitis, pneumonia, asthma, or epiglottitis. Blood tests, throat tests, and X-rays are not necessary to diagnose a common cold, but they may sometimes be helpful in excluding other more serious diseases. Your caregiver will decide if any further tests are required. RISKS AND COMPLICATIONS  You may be at risk for a more severe case of the common cold if you smoke cigarettes, have chronic heart disease (such as heart failure) or lung disease (such as asthma), or if you have a weakened immune system. The very young and very old are also at risk for more serious infections. Bacterial sinusitis, middle ear infections, and bacterial pneumonia can complicate the common cold. The common cold can worsen asthma and chronic obstructive pulmonary disease (COPD). Sometimes, these complications can require emergency medical care and may be life-threatening. PREVENTION  The best way to protect against  getting a cold is to practice good hygiene. Avoid oral or hand contact with people with cold symptoms. Wash your hands often if contact occurs. There is no clear evidence that vitamin C, vitamin E, echinacea, or exercise reduces the chance of developing a cold. However, it is always recommended to get plenty of rest and practice good nutrition. TREATMENT  Treatment is directed at relieving symptoms. There is no cure. Antibiotics are not effective, because the infection is caused by a virus, not by bacteria. Treatment may include:  Increased fluid intake. Sports drinks offer valuable electrolytes, sugars, and fluids.  Breathing heated mist or steam (vaporizer or shower).  Eating chicken soup or other clear broths, and maintaining good nutrition.  Getting plenty of rest.  Using gargles or lozenges for comfort.  Controlling fevers with ibuprofen or acetaminophen as directed by your caregiver.  Increasing usage of your inhaler if you have asthma. Zinc gel and zinc lozenges, taken in the first 24 hours of the common cold, can shorten the duration and lessen the severity of symptoms. Pain medicines may help with fever, muscle aches, and throat pain. A variety of non-prescription medicines are available to treat congestion and runny nose. Your caregiver can make recommendations and may suggest nasal or lung inhalers for other symptoms.  HOME CARE INSTRUCTIONS   Only take over-the-counter or   prescription medicines for pain, discomfort, or fever as directed by your caregiver.  Use a warm mist humidifier or inhale steam from a shower to increase air moisture. This may keep secretions moist and make it easier to breathe.  Drink enough water and fluids to keep your urine clear or pale yellow.  Rest as needed.  Return to work when your temperature has returned to normal or as your caregiver advises. You may need to stay home longer to avoid infecting others. You can also use a face mask and careful  hand washing to prevent spread of the virus. SEEK MEDICAL CARE IF:   After the first few days, you feel you are getting worse rather than better.  You need your caregiver's advice about medicines to control symptoms.  You develop chills, worsening shortness of breath, or brown or red sputum. These may be signs of pneumonia.  You develop yellow or brown nasal discharge or pain in the face, especially when you bend forward. These may be signs of sinusitis.  You develop a fever, swollen neck glands, pain with swallowing, or white areas in the back of your throat. These may be signs of strep throat. SEEK IMMEDIATE MEDICAL CARE IF:   You have a fever.  You develop severe or persistent headache, ear pain, sinus pain, or chest pain.  You develop wheezing, a prolonged cough, cough up blood, or have a change in your usual mucus (if you have chronic lung disease).  You develop sore muscles or a stiff neck. Document Released: 08/24/2000 Document Revised: 05/23/2011 Document Reviewed: 06/05/2013 ExitCare Patient Information 2015 [employer]. This information is not intended to replace advice given to you by your health care provider. Make sure you discuss any questions you have with your health care provider.  

## 2014-12-06 NOTE — ED Notes (Signed)
Pt. Stated, I started having a cold 2 weeks ago and now I have a cough

## 2014-12-06 NOTE — ED Notes (Signed)
Pt states she has chronic bronchitis and feels like it is worse today.  States she was been prescribed mucinex but she does not feel that it works for her.  She is requesting a cough syrup with a name that starts with an "H" stating that that syrup worked for her in the past.  Pt in no apparent distress at this time.

## 2015-03-12 ENCOUNTER — Emergency Department (HOSPITAL_COMMUNITY): Payer: Medicaid Other

## 2015-03-12 ENCOUNTER — Encounter (HOSPITAL_COMMUNITY): Payer: Self-pay

## 2015-03-12 ENCOUNTER — Emergency Department (HOSPITAL_COMMUNITY)
Admission: EM | Admit: 2015-03-12 | Discharge: 2015-03-12 | Disposition: A | Discharge status: Home / Self Care | Payer: Medicaid Other | Attending: Emergency Medicine | Admitting: Emergency Medicine

## 2015-03-12 DIAGNOSIS — Z791 Long term (current) use of non-steroidal anti-inflammatories (NSAID): Secondary | ICD-10-CM | POA: Insufficient documentation

## 2015-03-12 DIAGNOSIS — Z79899 Other long term (current) drug therapy: Secondary | ICD-10-CM | POA: Insufficient documentation

## 2015-03-12 DIAGNOSIS — Z87891 Personal history of nicotine dependence: Secondary | ICD-10-CM | POA: Insufficient documentation

## 2015-03-12 DIAGNOSIS — R197 Diarrhea, unspecified: Secondary | ICD-10-CM | POA: Insufficient documentation

## 2015-03-12 DIAGNOSIS — Z7952 Long term (current) use of systemic steroids: Secondary | ICD-10-CM | POA: Insufficient documentation

## 2015-03-12 DIAGNOSIS — J069 Acute upper respiratory infection, unspecified: Secondary | ICD-10-CM | POA: Insufficient documentation

## 2015-03-12 MED ORDER — AZITHROMYCIN 250 MG PO TABS: ORAL_TABLET | ORAL | Status: DC

## 2015-03-12 NOTE — ED Provider Notes (Signed)
CSN: [id number]     Arrival date & time 03/12/15  1044 History   None    Chief Complaint  Patient presents with  . Cough  . Generalized Body Aches   (Consider location/radiation/quality/duration/timing/severity/associated sxs/prior Treatment) HPI 24 y.o. female with a hx of bronchitis, presents to the Emergency Department today complaining of generalized body aches and cough for 2 weeks. She reports a sore throat as well with chest discomfort. Reported fever at one point last week, but not now. Has been having productive sputum that has a yellowish tiny. No hemoptysis. She has tried Nyquil, Theraflu and Robitussin with no relief. Reports some Diarrhea last night. Pt smokes regularly, but is trying to quit.   History reviewed. No pertinent past medical history. History reviewed. No pertinent past surgical history. Family History  Problem Relation Age of Onset  . Depression Mother   . Asthma Mother   . Asthma Maternal Aunt   . Heart disease Maternal Grandfather   . Depression Paternal Grandmother    Social History  Substance Use Topics  . Smoking status: Former Smoker -- 0.15 packs/day    Types: Cigarettes  . Smokeless tobacco: None  . Alcohol Use: No   OB History    No data available     Review of Systems  Constitutional: Positive for fever and fatigue. Negative for chills and diaphoresis.  HENT: Positive for congestion and sore throat. Negative for ear discharge, ear pain, postnasal drip, rhinorrhea, sinus pressure and tinnitus.   Eyes: Negative for visual disturbance.  Respiratory: Positive for cough, chest tightness and shortness of breath.   Cardiovascular: Negative for chest pain.  Gastrointestinal: Positive for diarrhea. Negative for nausea, vomiting, abdominal pain and constipation.  Endocrine: Negative for cold intolerance and heat intolerance.  Genitourinary: Negative for dysuria and hematuria.  Musculoskeletal: Negative for back pain.  Skin: Negative for color  change.  Neurological: Negative for dizziness, syncope, weakness, numbness and headaches.   Allergies  Review of patient's allergies indicates no known allergies.  Home Medications   Prior to Admission medications   Medication Sig Start Date End Date Taking? Authorizing Provider  albuterol (PROVENTIL HFA;VENTOLIN HFA) 108 (90 BASE) MCG/ACT inhaler Inhale 2 puffs into the lungs every 4 (four) hours as needed for wheezing or shortness of breath. 12/06/14   Everlene FarrierWilliam Dansie, PA-C  cetirizine (ZYRTEC ALLERGY) 10 MG tablet Take 1 tablet (10 mg total) by mouth daily. 12/06/14   Everlene FarrierWilliam Dansie, PA-C  dextromethorphan-guaiFENesin (MUCINEX DM) 30-600 MG per 12 hr tablet Take 1 tablet by mouth 2 (two) times daily. 12/06/14   Everlene FarrierWilliam Dansie, PA-C  naproxen (NAPROSYN) 250 MG tablet Take 1 tablet (250 mg total) by mouth 2 (two) times daily with a meal. 12/06/14   Everlene FarrierWilliam Dansie, PA-C  predniSONE (DELTASONE) 20 MG tablet Take 2 tablets (40 mg total) by mouth daily. 12/06/14   Everlene FarrierWilliam Dansie, PA-C   BP 111/73 mmHg  Pulse 81  Temp(Src) 97.7 F (36.5 C) (Oral)  Resp 18  SpO2 100%  LMP 02/26/2015 (Approximate)   Physical Exam  Constitutional: She is oriented to person, place, and time. Vital signs are normal. She appears well-developed and well-nourished.  HENT:  Head: Normocephalic and atraumatic.  Right Ear: Hearing, tympanic membrane, external ear and ear canal normal.  Left Ear: Hearing, tympanic membrane, external ear and ear canal normal.  Nose: Nose normal.  Mouth/Throat: Uvula is midline, oropharynx is clear and moist and mucous membranes are normal.  Eyes: Conjunctivae, EOM and lids are normal. Pupils  are equal, round, and reactive to light.  Neck: Trachea normal and normal range of motion. Neck supple.  Cardiovascular: Normal rate, regular rhythm, S1 normal, S2 normal, normal heart sounds, intact distal pulses and normal pulses.   Pulmonary/Chest: Effort normal and breath sounds normal.   Abdominal: Soft. Normal appearance and bowel sounds are normal. There is no tenderness.  Lymphadenopathy:    She has no cervical adenopathy.  Neurological: She is alert and oriented to person, place, and time.  Skin: Skin is warm and dry.  Psychiatric: She has a normal mood and affect. Her speech is normal and behavior is normal. Thought content normal.  Vitals reviewed.  ED Course  Procedures (including critical care time) Labs Review Labs Reviewed - No data to display  Imaging Review No results found. I have personally reviewed and evaluated these images and lab results as part of my medical decision-making.   EKG Interpretation None     MDM  I have reviewed relevant imaging studies. I obtained HPI from historian.  ED Course: Chest Xray for evaluation of Pneumonia- unremarkable   Assessment: 24y F with hx bronchitis presents with cough/sore throat x 2weeks. Will do a chest xray for evaluation of pneumonia. Pt afebrile. VS stable. Pt in NAD. Will most likely treat with Azithromycin due to duration of symptoms and follow up with PCP for further management.    Disposition/Plan:  DC home with ABX Additional Verbal discharge instructions given and discussed with patient.  Pt Instructed to f/u with PCP in the next 48 hours for evaluation and treatment of symptoms.   Supervising Physician Kevin StCathren Laine Final diagnoses:  Upper respiratory infection      Audry Pili, PA-C 03/12/15 1612  Cathren Laine, MD 03/14/15 [phone]

## 2015-03-12 NOTE — Discharge Instructions (Signed)
Please read and follow all provided instructions.  Your diagnoses today include: No diagnosis found.  Tests performed today include:  Chest x-ray - does not show any pneumonia  Vital signs. See below for your results today.   Medications prescribed:   Take any prescribed medications only as directed.  Home care instructions:  Follow any educational materials contained in this packet.  Follow-up instructions: Please follow-up with your primary care provider in the next 48 hours for further evaluation of your symptoms and a recheck if you are not feeling better.   Return instructions:   Please return to the Emergency Department if you experience worsening symptoms.  Please return with worsening wheezing, shortness of breath, or difficulty breathing.  Return with persistent fever above 101F.   Please return if you have any other emergent concerns.  Additional Information:  Your vital signs today were: BP 111/73 mmHg   Pulse 81   Temp(Src) 97.7 F (36.5 C) (Oral)   Resp 18   SpO2 100%   LMP 02/26/2015 (Approximate) If your blood pressure (BP) was elevated above 135/85 this visit, please have this repeated by your doctor within one month. --------------

## 2015-03-12 NOTE — ED Notes (Signed)
Pt reports she has had a cold for approx 2 weeks. Reports that she has generalized body aches and a cough as well. Pt also c/o sore throat.

## 2015-09-20 ENCOUNTER — Inpatient Hospital Stay (HOSPITAL_COMMUNITY)
Admission: AD | Admit: 2015-09-20 | Discharge: 2015-09-20 | Disposition: A | Discharge status: Home / Self Care | Payer: Medicaid Other | Source: Ambulatory Visit | Attending: Obstetrics & Gynecology | Admitting: Obstetrics & Gynecology

## 2015-09-20 ENCOUNTER — Encounter (HOSPITAL_COMMUNITY): Payer: Self-pay | Admitting: *Deleted

## 2015-09-20 DIAGNOSIS — Z3A01 Less than 8 weeks gestation of pregnancy: Secondary | ICD-10-CM | POA: Diagnosis present

## 2015-09-20 DIAGNOSIS — N760 Acute vaginitis: Secondary | ICD-10-CM | POA: Insufficient documentation

## 2015-09-20 DIAGNOSIS — R197 Diarrhea, unspecified: Secondary | ICD-10-CM | POA: Insufficient documentation

## 2015-09-20 DIAGNOSIS — O99611 Diseases of the digestive system complicating pregnancy, first trimester: Secondary | ICD-10-CM | POA: Diagnosis not present

## 2015-09-20 DIAGNOSIS — Z3201 Encounter for pregnancy test, result positive: Secondary | ICD-10-CM | POA: Diagnosis not present

## 2015-09-20 DIAGNOSIS — A499 Bacterial infection, unspecified: Secondary | ICD-10-CM

## 2015-09-20 DIAGNOSIS — B9689 Other specified bacterial agents as the cause of diseases classified elsewhere: Secondary | ICD-10-CM | POA: Insufficient documentation

## 2015-09-20 DIAGNOSIS — Z87891 Personal history of nicotine dependence: Secondary | ICD-10-CM | POA: Insufficient documentation

## 2015-09-20 DIAGNOSIS — O23591 Infection of other part of genital tract in pregnancy, first trimester: Secondary | ICD-10-CM | POA: Diagnosis not present

## 2015-09-20 DIAGNOSIS — N9489 Other specified conditions associated with female genital organs and menstrual cycle: Secondary | ICD-10-CM | POA: Diagnosis present

## 2015-09-20 HISTORY — DX: Other specified health status: Z78.9

## 2015-09-20 LAB — URINALYSIS, ROUTINE W REFLEX MICROSCOPIC
BILIRUBIN URINE: NEGATIVE
Glucose, UA: NEGATIVE mg/dL
Hgb urine dipstick: NEGATIVE
KETONES UR: 15 mg/dL — AB
LEUKOCYTES UA: NEGATIVE
NITRITE: NEGATIVE
PH: 6 (ref 5.0–8.0)
PROTEIN: NEGATIVE mg/dL
Specific Gravity, Urine: 1.02 (ref 1.005–1.030)

## 2015-09-20 LAB — POCT PREGNANCY, URINE: Preg Test, Ur: POSITIVE — AB

## 2015-09-20 LAB — WET PREP, GENITAL
SPERM: NONE SEEN
TRICH WET PREP: NONE SEEN
YEAST WET PREP: NONE SEEN

## 2015-09-20 MED ORDER — PRENATAL VITAMIN 27-0.8 MG PO TABS: 1.0000 | ORAL_TABLET | Freq: Every day | ORAL | Status: DC

## 2015-09-20 MED ORDER — METRONIDAZOLE 500 MG PO TABS: 500.0000 mg | ORAL_TABLET | Freq: Two times a day (BID) | ORAL | Status: DC

## 2015-09-20 NOTE — MAU Note (Signed)
Cramping and vaginal discharge x 1 week, positive home pregnancy test, LMP 08/13/15

## 2015-09-20 NOTE — Discharge Instructions (Signed)

## 2015-09-20 NOTE — MAU Provider Note (Signed)
History     CSN: [id number]  Arrival date and time: 09/20/15 1641   First Provider Initiated Contact with Patient 09/20/15 1723      No chief complaint on file.  HPI    Ms.Jennifer Campbell is a 25 y.o. female G1P0 @ 8139w3d  here with cramping in the vagina. Recent new sexual partner. Would like STI testing today and a pregnancy verification letter. She denies abdominal pain or vaginal bleeding.   She is requesting Rx for prenatal vitamins.  OB History    Gravida Para Term Preterm AB TAB SAB Ectopic Multiple Living   1               Past Medical History  Diagnosis Date  . Medical history non-contributory     Past Surgical History  Procedure Laterality Date  . No past surgeries      Family History  Problem Relation Age of Onset  . Depression Mother   . Asthma Mother   . Asthma Maternal Aunt   . Heart disease Maternal Grandfather   . Depression Paternal Grandmother     Social History  Substance Use Topics  . Smoking status: Former Smoker -- 0.15 packs/day    Types: Cigarettes  . Smokeless tobacco: None  . Alcohol Use: No    Allergies: No Known Allergies  Prescriptions prior to admission  Medication Sig Dispense Refill Last Dose  . albuterol (PROVENTIL HFA;VENTOLIN HFA) 108 (90 BASE) MCG/ACT inhaler Inhale 2 puffs into the lungs every 4 (four) hours as needed for wheezing or shortness of breath. 1 Inhaler 0   . azithromycin (ZITHROMAX Z-PAK) 250 MG tablet 500mg  PO once daily on day 1. THEN 250mg  PO once daily for 4 days 6 tablet 0   . cetirizine (ZYRTEC ALLERGY) 10 MG tablet Take 1 tablet (10 mg total) by mouth daily. 30 tablet 1   . dextromethorphan-guaiFENesin (MUCINEX DM) 30-600 MG per 12 hr tablet Take 1 tablet by mouth 2 (two) times daily. 14 tablet 0   . naproxen (NAPROSYN) 250 MG tablet Take 1 tablet (250 mg total) by mouth 2 (two) times daily with a meal. 30 tablet 0   . predniSONE (DELTASONE) 20 MG tablet Take 2 tablets (40 mg total) by mouth daily.  10 tablet 0    Results for orders placed or performed during the hospital encounter of 09/20/15 (from the past 48 hour(s))  Urinalysis, Routine w reflex microscopic (not at [facility])     Status: Abnormal   Collection Time: 09/20/15  4:55 PM  Result Value Ref Range   Color, Urine YELLOW YELLOW   APPearance CLEAR CLEAR   Specific Gravity, Urine 1.020 1.005 - 1.030   pH 6.0 5.0 - 8.0   Glucose, UA NEGATIVE NEGATIVE mg/dL   Hgb urine dipstick NEGATIVE NEGATIVE   Bilirubin Urine NEGATIVE NEGATIVE   Ketones, ur 15 (A) NEGATIVE mg/dL   Protein, ur NEGATIVE NEGATIVE mg/dL   Nitrite NEGATIVE NEGATIVE   Leukocytes, UA NEGATIVE NEGATIVE    Comment: MICROSCOPIC NOT DONE ON URINES WITH NEGATIVE PROTEIN, BLOOD, LEUKOCYTES, NITRITE, OR GLUCOSE <1000 mg/dL.  Pregnancy, urine POC     Status: Abnormal   Collection Time: 09/20/15  5:07 PM  Result Value Ref Range   Preg Test, Ur POSITIVE (A) NEGATIVE    Comment:        THE SENSITIVITY OF THIS METHODOLOGY IS >24 mIU/mL   Wet prep, genital     Status: Abnormal   Collection Time: 09/20/15  5:35  PM  Result Value Ref Range   Yeast Wet Prep HPF POC NONE SEEN NONE SEEN   Trich, Wet Prep NONE SEEN NONE SEEN   Clue Cells Wet Prep HPF POC PRESENT (A) NONE SEEN   WBC, Wet Prep HPF POC FEW (A) NONE SEEN    Comment: MANY BACTERIA SEEN   Sperm NONE SEEN     Review of Systems  Constitutional: Negative for fever and chills.  Gastrointestinal: Positive for diarrhea. Negative for heartburn, nausea, vomiting, abdominal pain and constipation.  Genitourinary: Negative for dysuria and urgency.       Denies vaginal bleeding    Physical Exam   Blood pressure 127/62, pulse 66, temperature 98.4 F (36.9 C), temperature source Oral, resp. rate 17, height  (1.626 m), weight 125 lb (56.7 kg), last menstrual period 08/13/2015, SpO2 99 %.  Physical Exam  Constitutional: She is oriented to person, place, and time. She appears well-developed and well-nourished. No  distress.  HENT:  Head: Normocephalic.  Eyes: Pupils are equal, round, and reactive to light.  Neck: Neck supple.  Respiratory: Effort normal.  GI: Soft. She exhibits no distension and no mass. There is no tenderness. There is no rebound and no guarding.  Genitourinary:  Speculum exam: Vagina - Small amount of creamy discharge, no odor Cervix - No contact bleeding Bimanual exam: Cervix closed Uterus non tender, normal size Adnexa non tender, no masses bilaterally GC/Chlam, wet prep done Chaperone present for exam.  Musculoskeletal: Normal range of motion.  Neurological: She is alert and oriented to person, place, and time.  Skin: Skin is warm. She is not diaphoretic.  Psychiatric: Her behavior is normal.    MAU Course  Procedures  None  MDM  Wet prep GC   Assessment and Plan    A:  1. BV (bacterial vaginosis)   2. Encounter for pregnancy test, result positive     P:  Discharge home in stable condition Return to MAU if symptoms worsen First trimester warning signs Start prenatal care Rx: Flagyl, prenatal vitamins   Duane Lope, NP 09/20/2015 6:19 PM

## 2015-09-21 LAB — GC/CHLAMYDIA PROBE AMP (~~LOC~~) NOT AT ARMC
Chlamydia: NEGATIVE
Neisseria Gonorrhea: NEGATIVE

## 2015-10-01 ENCOUNTER — Inpatient Hospital Stay (HOSPITAL_COMMUNITY): Payer: Medicaid Other

## 2015-10-01 ENCOUNTER — Inpatient Hospital Stay (HOSPITAL_COMMUNITY)
Admission: AD | Admit: 2015-10-01 | Discharge: 2015-10-01 | Disposition: A | Discharge status: Home / Self Care | Payer: Medicaid Other | Source: Ambulatory Visit | Attending: Family Medicine | Admitting: Family Medicine

## 2015-10-01 ENCOUNTER — Encounter (HOSPITAL_COMMUNITY): Payer: Self-pay | Admitting: *Deleted

## 2015-10-01 DIAGNOSIS — B373 Candidiasis of vulva and vagina: Secondary | ICD-10-CM | POA: Insufficient documentation

## 2015-10-01 DIAGNOSIS — Z87891 Personal history of nicotine dependence: Secondary | ICD-10-CM | POA: Insufficient documentation

## 2015-10-01 DIAGNOSIS — R109 Unspecified abdominal pain: Secondary | ICD-10-CM | POA: Diagnosis not present

## 2015-10-01 DIAGNOSIS — O209 Hemorrhage in early pregnancy, unspecified: Secondary | ICD-10-CM | POA: Insufficient documentation

## 2015-10-01 DIAGNOSIS — O4691 Antepartum hemorrhage, unspecified, first trimester: Secondary | ICD-10-CM

## 2015-10-01 DIAGNOSIS — O26899 Other specified pregnancy related conditions, unspecified trimester: Secondary | ICD-10-CM

## 2015-10-01 DIAGNOSIS — O9989 Other specified diseases and conditions complicating pregnancy, childbirth and the puerperium: Secondary | ICD-10-CM

## 2015-10-01 DIAGNOSIS — B379 Candidiasis, unspecified: Secondary | ICD-10-CM

## 2015-10-01 DIAGNOSIS — O98811 Other maternal infectious and parasitic diseases complicating pregnancy, first trimester: Secondary | ICD-10-CM | POA: Insufficient documentation

## 2015-10-01 DIAGNOSIS — Z3A01 Less than 8 weeks gestation of pregnancy: Secondary | ICD-10-CM | POA: Insufficient documentation

## 2015-10-01 DIAGNOSIS — O26891 Other specified pregnancy related conditions, first trimester: Secondary | ICD-10-CM | POA: Insufficient documentation

## 2015-10-01 DIAGNOSIS — N939 Abnormal uterine and vaginal bleeding, unspecified: Secondary | ICD-10-CM | POA: Diagnosis present

## 2015-10-01 LAB — HCG, QUANTITATIVE, PREGNANCY: HCG, BETA CHAIN, QUANT, S: 17030 m[IU]/mL — AB (ref <5)

## 2015-10-01 LAB — CBC WITH DIFFERENTIAL/PLATELET
BASOS PCT: 0 %
Basophils Absolute: 0 10*3/uL (ref 0.0–0.1)
EOS ABS: 0.3 10*3/uL (ref 0.0–0.7)
EOS PCT: 4 %
HCT: 34.3 % — ABNORMAL LOW (ref 36.0–46.0)
Hemoglobin: 11.8 g/dL — ABNORMAL LOW (ref 12.0–15.0)
LYMPHS PCT: 45 %
Lymphs Abs: 3.1 10*3/uL (ref 0.7–4.0)
MCH: 31.5 pg (ref 26.0–34.0)
MCHC: 34.4 g/dL (ref 30.0–36.0)
MCV: 91.5 fL (ref 78.0–100.0)
MONO ABS: 0.3 10*3/uL (ref 0.1–1.0)
Monocytes Relative: 5 %
NEUTROS ABS: 3.1 10*3/uL (ref 1.7–7.7)
Neutrophils Relative %: 46 %
Platelets: 241 10*3/uL (ref 150–400)
RBC: 3.75 MIL/uL — ABNORMAL LOW (ref 3.87–5.11)
RDW: 12.7 % (ref 11.5–15.5)
WBC: 6.9 10*3/uL (ref 4.0–10.5)

## 2015-10-01 LAB — URINALYSIS, ROUTINE W REFLEX MICROSCOPIC
BILIRUBIN URINE: NEGATIVE
GLUCOSE, UA: NEGATIVE mg/dL
KETONES UR: 15 mg/dL — AB
Nitrite: NEGATIVE
PH: 6 (ref 5.0–8.0)
PROTEIN: NEGATIVE mg/dL
Specific Gravity, Urine: 1.02 (ref 1.005–1.030)

## 2015-10-01 LAB — WET PREP, GENITAL
CLUE CELLS WET PREP: NONE SEEN
Sperm: NONE SEEN
Trich, Wet Prep: NONE SEEN

## 2015-10-01 LAB — URINE MICROSCOPIC-ADD ON: RBC / HPF: NONE SEEN RBC/hpf (ref 0–5)

## 2015-10-01 MED ORDER — TERCONAZOLE 0.4 % VA CREA: 1.0000 | TOPICAL_CREAM | Freq: Every day | VAGINAL | Status: DC

## 2015-10-01 NOTE — MAU Note (Addendum)
PT SAYS  SHE HAD PINK-  STARTED AT 2PM. - IN HER UNDERWEAR.   NO PAD  ON NOW .    SAYS HER VAGINA  IS    ITCHY  AND  IRRITATED-  STARTED   ON Tuesday.     SAYS HER BACK  STARTED HURTING  THIS AM.   PLANS  TO GET [facility]  WITH  [facility]-  BUT  HAS NOT CALLED YET     .    LAST SEX-     2 WEEKS  AGO.      HAD  MILD  CRAMPS  TODAY-   NO MEDS

## 2015-10-01 NOTE — MAU Provider Note (Signed)
History     CSN: [id number]  Arrival date and time: 10/01/15 1845   None     Chief Complaint  Patient presents with  . Vaginal Bleeding   Vaginal Bleeding The patient's primary symptoms include genital itching, pelvic pain and vaginal bleeding. This is a new problem. The current episode started today. The problem occurs intermittently. The problem has been unchanged. Pain severity now: 7/10  The problem affects both sides. Associated symptoms include abdominal pain. Pertinent negatives include no chills, constipation, diarrhea, dysuria, fever, frequency, nausea, urgency or vomiting. The vaginal discharge was thick. The vaginal bleeding is spotting. She has not been passing clots. She has not been passing tissue. Nothing aggravates the symptoms. She has tried nothing for the symptoms. Her menstrual history has been regular (LMP 08/13/15 ).    Past Medical History  Diagnosis Date  . Medical history non-contributory     Past Surgical History  Procedure Laterality Date  . No past surgeries      Family History  Problem Relation Age of Onset  . Depression Mother   . Asthma Mother   . Asthma Maternal Aunt   . Heart disease Maternal Grandfather   . Depression Paternal Grandmother     Social History  Substance Use Topics  . Smoking status: Former Smoker -- 0.15 packs/day    Types: Cigarettes  . Smokeless tobacco: None  . Alcohol Use: No    Allergies: No Known Allergies  Prescriptions prior to admission  Medication Sig Dispense Refill Last Dose  . albuterol (PROVENTIL HFA;VENTOLIN HFA) 108 (90 BASE) MCG/ACT inhaler Inhale 2 puffs into the lungs every 4 (four) hours as needed for wheezing or shortness of breath. 1 Inhaler 0   . cetirizine (ZYRTEC ALLERGY) 10 MG tablet Take 1 tablet (10 mg total) by mouth daily. 30 tablet 1   . metroNIDAZOLE (FLAGYL) 500 MG tablet Take 1 tablet (500 mg total) by mouth 2 (two) times daily. 14 tablet 0   . Prenatal Vit-Fe Fumarate-FA (PRENATAL  VITAMIN) 27-0.8 MG TABS Take 1 tablet by mouth daily. 60 tablet 1     Review of Systems  Constitutional: Negative for fever and chills.  Gastrointestinal: Positive for abdominal pain. Negative for nausea, vomiting, diarrhea and constipation.  Genitourinary: Positive for vaginal bleeding and pelvic pain. Negative for dysuria, urgency and frequency.   Physical Exam   Blood pressure 115/49, pulse 65, temperature 98.1 F (36.7 C), temperature source Oral, resp. rate 18, height 5\' 2"  (1.575 m), weight 127 lb 12 oz (57.947 kg), last menstrual period 08/13/2015.  Physical Exam  Nursing note and vitals reviewed. Constitutional: She is oriented to person, place, and time. She appears well-developed and well-nourished. No distress.  HENT:  Head: Normocephalic.  Cardiovascular: Normal rate.   Respiratory: Effort normal.  GI: Soft. There is no tenderness. There is no rebound.  Neurological: She is alert and oriented to person, place, and time.  Skin: Skin is warm and dry.  Psychiatric: She has a normal mood and affect.    Results for orders placed or performed during the hospital encounter of 10/01/15 (from the past 24 hour(s))  CBC with Differential     Status: Abnormal   Collection Time: 10/01/15  7:07 PM  Result Value Ref Range   WBC 6.9 4.0 - 10.5 K/uL   RBC 3.75 (L) 3.87 - 5.11 MIL/uL   Hemoglobin 11.8 (L) 12.0 - 15.0 g/dL   HCT 40.934.3 (L) 81.136.0 - 91.446.0 %   MCV 91.5 78.0 -  100.0 fL   MCH 31.5 26.0 - 34.0 pg   MCHC 34.4 30.0 - 36.0 g/dL   RDW 16.1 09.6 - 04.5 %   Platelets 241 150 - 400 K/uL   Neutrophils Relative % 46 %   Neutro Abs 3.1 1.7 - 7.7 K/uL   Lymphocytes Relative 45 %   Lymphs Abs 3.1 0.7 - 4.0 K/uL   Monocytes Relative 5 %   Monocytes Absolute 0.3 0.1 - 1.0 K/uL   Eosinophils Relative 4 %   Eosinophils Absolute 0.3 0.0 - 0.7 K/uL   Basophils Relative 0 %   Basophils Absolute 0.0 0.0 - 0.1 K/uL  hCG, quantitative, pregnancy     Status: Abnormal   Collection Time:  10/01/15  7:08 PM  Result Value Ref Range   hCG, Beta Chain, Quant, S 17030 (H) <5 mIU/mL  ABO/Rh     Status: None (Preliminary result)   Collection Time: 10/01/15  7:09 PM  Result Value Ref Range   ABO/RH(D) A POS   Urinalysis, Routine w reflex microscopic (not at [facility])     Status: Abnormal   Collection Time: 10/01/15  8:09 PM  Result Value Ref Range   Color, Urine YELLOW YELLOW   APPearance CLEAR CLEAR   Specific Gravity, Urine 1.020 1.005 - 1.030   pH 6.0 5.0 - 8.0   Glucose, UA NEGATIVE NEGATIVE mg/dL   Hgb urine dipstick TRACE (A) NEGATIVE   Bilirubin Urine NEGATIVE NEGATIVE   Ketones, ur 15 (A) NEGATIVE mg/dL   Protein, ur NEGATIVE NEGATIVE mg/dL   Nitrite NEGATIVE NEGATIVE   Leukocytes, UA SMALL (A) NEGATIVE  Urine microscopic-add on     Status: Abnormal   Collection Time: 10/01/15  8:09 PM  Result Value Ref Range   Squamous Epithelial / LPF 0-5 (A) NONE SEEN   WBC, UA 0-5 0 - 5 WBC/hpf   RBC / HPF NONE SEEN 0 - 5 RBC/hpf   Bacteria, UA FEW (A) NONE SEEN   Urine-Other MUCOUS PRESENT   Wet prep, genital     Status: Abnormal   Collection Time: 10/01/15  8:50 PM  Result Value Ref Range   Yeast Wet Prep HPF POC PRESENT (A) NONE SEEN   Trich, Wet Prep NONE SEEN NONE SEEN   Clue Cells Wet Prep HPF POC NONE SEEN NONE SEEN   WBC, Wet Prep HPF POC FEW (A) NONE SEEN   Sperm NONE SEEN    US Ob Comp Less 14 Wks  10/01/2015  CLINICAL DATA:  Vaginal bleeding in a pregnant patient. [place] of [address]. EXAM: OBSTETRIC <14 WK [place] AND TRANSVAGINAL OB US TECHNIQUE: Both transabdominal and transvaginal ultrasound examinations were performed for complete evaluation of the gestation as well as the maternal uterus, adnexal regions, and pelvic cul-de-sac. Transvaginal technique was performed to assess early pregnancy. COMPARISON:  Non for this pregnancy. FINDINGS: Intrauterine gestational sac: Present. Yolk sac:  Present. Embryo:  Present. Cardiac Activity: Present. Heart Rate: 123   bpm CRL:  2.3  mm   5 w   5 d                  [place] EDC: 05/28/2016 Subchorionic hemorrhage:  None visualized. Maternal uterus/adnexae: Within normal limits. Corpus luteum is seen associated with the left ovary measuring 1.6 x 1.0 x 1.4 cm. IMPRESSION: Single live intrauterine pregnancy corresponding to 5 weeks and 5 days gestation. According to patient's last menstrual period the gestational age is calculated at 7 weeks 0 days.  Clinical follow-up and follow-up with serial beta HCG values is recommended. Recommend follow-up US in 10-14 days for definitive diagnosis. Electronically Signed   By: Ted Mcalpine M.D.   On: 10/01/2015 20:55   US Ob Transvaginal  10/01/2015  CLINICAL DATA:  Vaginal bleeding in a pregnant patient. Serum [place] of 119[address]. EXAM: OBSTETRIC <14 WK [place] AND TRANSVAGINAL OB US TECHNIQUE: Both transabdominal and transvaginal ultrasound examinations were performed for complete evaluation of the gestation as well as the maternal uterus, adnexal regions, and pelvic cul-de-sac. Transvaginal technique was performed to assess early pregnancy. COMPARISON:  Non for this pregnancy. FINDINGS: Intrauterine gestational sac: Present. Yolk sac:  Present. Embryo:  Present. Cardiac Activity: Present. Heart Rate: 123  bpm CRL:  2.3  mm   5 w   5 d                  [place] EDC: 05/28/2016 Subchorionic hemorrhage:  None visualized. Maternal uterus/adnexae: Within normal limits. Corpus luteum is seen associated with the left ovary measuring 1.6 x 1.0 x 1.4 cm. IMPRESSION: Single live intrauterine pregnancy corresponding to 5 weeks and 5 days gestation. According to patient's last menstrual period the gestational age is calculated at 7 weeks 0 days. Clinical follow-up and follow-up with serial beta HCG values is recommended. Recommend follow-up US in 10-14 days for definitive diagnosis. Electronically Signed   By: Ted Mcalpine M.D.   On: 10/01/2015 20:55    MAU Course   Procedures  MDM   Assessment and Plan   1. Yeast infection   2. Abdominal pain in pregnancy   3. Vaginal bleeding in pregnancy, first trimester    DC home Comfort measures reviewed  1st Trimester precautions  Bleeding precautions RX: terazol 7 as directed.  Return to MAU as needed FU with OB as planned  Follow-up Information    Schedule an appointment as soon as possible for a visit with [facility].   Contact information:   [address] [place] [address] [phone]         Tawnya Crook 10/01/2015, 9:30 PM

## 2015-10-01 NOTE — Discharge Instructions (Signed)
Monilial Vaginitis Vaginitis in a soreness, swelling and redness (inflammation) of the vagina and vulva. Monilial vaginitis is not a sexually transmitted infection. CAUSES  Yeast vaginitis is caused by yeast (candida) that is normally found in your vagina. With a yeast infection, the candida has overgrown in number to a point that upsets the chemical balance. SYMPTOMS   White, thick vaginal discharge.  Swelling, itching, redness and irritation of the vagina and possibly the lips of the vagina (vulva).  Burning or painful urination.  Painful intercourse. DIAGNOSIS  Things that may contribute to monilial vaginitis are:  Postmenopausal and virginal states.  Pregnancy.  Infections.  Being tired, sick or stressed, especially if you had monilial vaginitis in the past.  Diabetes. Good control will help lower the chance.  Birth control pills.  Tight fitting garments.  Using bubble bath, feminine sprays, douches or deodorant tampons.  Taking certain medications that kill germs (antibiotics).  Sporadic recurrence can occur if you become ill. TREATMENT  Your caregiver will give you medication.  There are several kinds of anti monilial vaginal creams and suppositories specific for monilial vaginitis. For recurrent yeast infections, use a suppository or cream in the vagina 2 times a week, or as directed.  Anti-monilial or steroid cream for the itching or irritation of the vulva may also be used. Get your caregiver's permission.  Painting the vagina with methylene blue solution may help if the monilial cream does not work.  Eating yogurt may help prevent monilial vaginitis. HOME CARE INSTRUCTIONS   Finish all medication as prescribed.  Do not have sex until treatment is completed or after your caregiver tells you it is okay.  Take warm sitz baths.  Do not douche.  Do not use tampons, especially scented ones.  Wear cotton underwear.  Avoid tight pants and panty  hose.  Tell your sexual partner that you have a yeast infection. They should go to their caregiver if they have symptoms such as mild rash or itching.  Your sexual partner should be treated as well if your infection is difficult to eliminate.  Practice safer sex. Use condoms.  Some vaginal medications cause latex condoms to fail. Vaginal medications that harm condoms are:  Cleocin cream.  Butoconazole (Femstat).  Terconazole (Terazol) vaginal suppository.  Miconazole (Monistat) (may be purchased over the counter). SEEK MEDICAL CARE IF:   You have a temperature by mouth above 102 F (38.9 C).  The infection is getting worse after 2 days of treatment.  The infection is not getting better after 3 days of treatment.  You develop blisters in or around your vagina.  You develop vaginal bleeding, and it is not your menstrual period.  You have pain when you urinate.  You develop intestinal problems.  You have pain with sexual intercourse.   This information is not intended to replace advice given to you by your health care provider. Make sure you discuss any questions you have with your health care provider.   Document Released: 12/08/2004 Document Revised: 05/23/2011 Document Reviewed: 09/01/2014 Elsevier Interactive Patient Education 2016 [employer]   First Trimester of Pregnancy The first trimester of pregnancy is from week 1 until the end of week 12 (months 1 through 3). A week after a sperm fertilizes an egg, the egg will implant on the wall of the uterus. This embryo will begin to develop into a baby. Genes from you and your partner are forming the baby. The female genes determine whether the baby is a boy or a   girl. At 6-8 weeks, the eyes and face are formed, and the heartbeat can be seen on ultrasound. At the end of 12 weeks, all the baby's organs are formed.  Now that you are pregnant, you will want to do everything you can to have a healthy baby. Two of the most  important things are to get good prenatal care and to follow your health care provider's instructions. Prenatal care is all the medical care you receive before the baby's birth. This care will help prevent, find, and treat any problems during the pregnancy and childbirth. BODY CHANGES Your body goes through many changes during pregnancy. The changes vary from woman to woman.   You may gain or lose a couple of pounds at first.  You may feel sick to your stomach (nauseous) and throw up (vomit). If the vomiting is uncontrollable, call your health care provider.  You may tire easily.  You may develop headaches that can be relieved by medicines approved by your health care provider.  You may urinate more often. Painful urination may mean you have a bladder infection.  You may develop heartburn as a result of your pregnancy.  You may develop constipation because certain hormones are causing the muscles that push waste through your intestines to slow down.  You may develop hemorrhoids or swollen, bulging veins (varicose veins).  Your breasts may begin to grow larger and become tender. Your nipples may stick out more, and the tissue that surrounds them (areola) may become darker.  Your gums may bleed and may be sensitive to brushing and flossing.  Dark spots or blotches (chloasma, mask of pregnancy) may develop on your face. This will likely fade after the baby is born.  Your menstrual periods will stop.  You may have a loss of appetite.  You may develop cravings for certain kinds of food.  You may have changes in your emotions from day to day, such as being excited to be pregnant or being concerned that something may go wrong with the pregnancy and baby.  You may have more vivid and strange dreams.  You may have changes in your hair. These can include thickening of your hair, rapid growth, and changes in texture. Some women also have hair loss during or after pregnancy, or hair that  feels dry or thin. Your hair will most likely return to normal after your baby is born. WHAT TO EXPECT AT YOUR PRENATAL VISITS During a routine prenatal visit:  You will be weighed to make sure you and the baby are growing normally.  Your blood pressure will be taken.  Your abdomen will be measured to track your baby's growth.  The fetal heartbeat will be listened to starting around week 10 or 12 of your pregnancy.  Test results from any previous visits will be discussed. Your health care provider may ask you:  How you are feeling.  If you are feeling the baby move.  If you have had any abnormal symptoms, such as leaking fluid, bleeding, severe headaches, or abdominal cramping.  If you are using any tobacco products, including cigarettes, chewing tobacco, and electronic cigarettes.  If you have any questions. Other tests that may be performed during your first trimester include:  Blood tests to find your blood type and to check for the presence of any previous infections. They will also be used to check for low iron levels (anemia) and Rh antibodies. Later in the pregnancy, blood tests for diabetes will be done along with other   tests if problems develop.  Urine tests to check for infections, diabetes, or protein in the urine.  An ultrasound to confirm the proper growth and development of the baby.  An amniocentesis to check for possible genetic problems.  Fetal screens for spina bifida and Down syndrome.  You may need other tests to make sure you and the baby are doing well.  HIV (human immunodeficiency virus) testing. Routine prenatal testing includes screening for HIV, unless you choose not to have this test. HOME CARE INSTRUCTIONS  Medicines  Follow your health care provider's instructions regarding medicine use. Specific medicines may be either safe or unsafe to take during pregnancy.  Take your prenatal vitamins as directed.  If you develop constipation, try taking  a stool softener if your health care provider approves. Diet  Eat regular, well-balanced meals. Choose a variety of foods, such as meat or vegetable-based protein, fish, milk and low-fat dairy products, vegetables, fruits, and whole grain breads and cereals. Your health care provider will help you determine the amount of weight gain that is right for you.  Avoid raw meat and uncooked cheese. These carry germs that can cause birth defects in the baby.  Eating four or five small meals rather than three large meals a day may help relieve nausea and vomiting. If you start to feel nauseous, eating a few soda crackers can be helpful. Drinking liquids between meals instead of during meals also seems to help nausea and vomiting.  If you develop constipation, eat more high-fiber foods, such as fresh vegetables or fruit and whole grains. Drink enough fluids to keep your urine clear or pale yellow. Activity and Exercise  Exercise only as directed by your health care provider. Exercising will help you:  Control your weight.  Stay in shape.  Be prepared for labor and delivery.  Experiencing pain or cramping in the lower abdomen or low back is a good sign that you should stop exercising. Check with your health care provider before continuing normal exercises.  Try to avoid standing for long periods of time. Move your legs often if you must stand in one place for a long time.  Avoid heavy lifting.  Wear low-heeled shoes, and practice good posture.  You may continue to have sex unless your health care provider directs you otherwise. Relief of Pain or Discomfort  Wear a good support bra for breast tenderness.   Take warm sitz baths to soothe any pain or discomfort caused by hemorrhoids. Use hemorrhoid cream if your health care provider approves.   Rest with your legs elevated if you have leg cramps or low back pain.  If you develop varicose veins in your legs, wear support hose. Elevate your  feet for 15 minutes, 3-4 times a day. Limit salt in your diet. Prenatal Care  Schedule your prenatal visits by the twelfth week of pregnancy. They are usually scheduled monthly at first, then more often in the last 2 months before delivery.  Write down your questions. Take them to your prenatal visits.  Keep all your prenatal visits as directed by your health care provider. Safety  Wear your seat belt at all times when driving.  Make a list of emergency phone numbers, including numbers for family, friends, the hospital, and police and fire departments. General Tips  Ask your health care provider for a referral to a local prenatal education class. Begin classes no later than at the beginning of month 6 of your pregnancy.  Ask for help if you   have counseling or nutritional needs during pregnancy. Your health care provider can offer advice or refer you to specialists for help with various needs.  Do not use hot tubs, steam rooms, or saunas.  Do not douche or use tampons or scented sanitary pads.  Do not cross your legs for long periods of time.  Avoid cat litter boxes and soil used by cats. These carry germs that can cause birth defects in the baby and possibly loss of the fetus by miscarriage or stillbirth.  Avoid all smoking, herbs, alcohol, and medicines not prescribed by your health care provider. Chemicals in these affect the formation and growth of the baby.  Do not use any tobacco products, including cigarettes, chewing tobacco, and electronic cigarettes. If you need help quitting, ask your health care provider. You may receive counseling support and other resources to help you quit.  Schedule a dentist appointment. At home, brush your teeth with a soft toothbrush and be gentle when you floss. SEEK MEDICAL CARE IF:   You have dizziness.  You have mild pelvic cramps, pelvic pressure, or nagging pain in the abdominal area.  You have persistent nausea, vomiting, or  diarrhea.  You have a bad smelling vaginal discharge.  You have pain with urination.  You notice increased swelling in your face, hands, legs, or ankles. SEEK IMMEDIATE MEDICAL CARE IF:   You have a fever.  You are leaking fluid from your vagina.  You have spotting or bleeding from your vagina.  You have severe abdominal cramping or pain.  You have rapid weight gain or loss.  You vomit blood or material that looks like coffee grounds.  You are exposed to German measles and have never had them.  You are exposed to fifth disease or chickenpox.  You develop a severe headache.  You have shortness of breath.  You have any kind of trauma, such as from a fall or a car accident.   This information is not intended to replace advice given to you by your health care provider. Make sure you discuss any questions you have with your health care provider.   Document Released: 02/22/2001 Document Revised: 03/21/2014 Document Reviewed: 01/08/2013 Elsevier Interactive Patient Education 2016 [employer] Inc.  

## 2015-10-02 LAB — GC/CHLAMYDIA PROBE AMP (~~LOC~~) NOT AT ARMC
Chlamydia: NEGATIVE
NEISSERIA GONORRHEA: NEGATIVE

## 2015-10-02 LAB — ABO/RH: ABO/RH(D): A POS

## 2015-12-01 ENCOUNTER — Emergency Department (HOSPITAL_COMMUNITY)
Admission: EM | Admit: 2015-12-01 | Discharge: 2015-12-02 | Disposition: A | Discharge status: Home / Self Care | Payer: Medicaid Other | Attending: Emergency Medicine | Admitting: Emergency Medicine

## 2015-12-01 ENCOUNTER — Encounter (HOSPITAL_COMMUNITY): Payer: Self-pay

## 2015-12-01 DIAGNOSIS — Z87891 Personal history of nicotine dependence: Secondary | ICD-10-CM | POA: Diagnosis not present

## 2015-12-01 DIAGNOSIS — B370 Candidal stomatitis: Secondary | ICD-10-CM | POA: Diagnosis not present

## 2015-12-01 DIAGNOSIS — R682 Dry mouth, unspecified: Secondary | ICD-10-CM | POA: Diagnosis present

## 2015-12-01 NOTE — ED Triage Notes (Signed)
Pt complains of dry mouth and a white tongue for several days, she also states she has been spotting for weeks

## 2015-12-02 MED ORDER — NYSTATIN 100000 UNIT/ML MT SUSP
500000.0000 [IU] | Freq: Four times a day (QID) | OROMUCOSAL | 1 refills | Status: DC
Start: 2015-12-02 — End: 2017-11-27

## 2015-12-02 NOTE — ED Provider Notes (Signed)
[facility]-EMERGENCY DEPT Provider Note   CSN: [id number] Arrival date & time: 12/01/15  2254  By signing my name below, I, Majel Homer, attest that this documentation has been prepared under the direction and in the presence of non-physician practitioner, Antony Madura, PA-C. Electronically Signed: Majel Homer, Scribe. 12/02/2015. 1:11 AM.  History   Chief Complaint Chief Complaint  Patient presents with  . dry mouth  . Vaginal Bleeding   The history is provided by the patient. No language interpreter was used.   HPI Comments: Jennifer Campbell is a 25 y.o. female who presents to the Emergency Department for an evaluation of dry mouth and a "white tongue" that began 1 week ago and worsened today. Pt reports she has been drinking a lot of liquids to stay hydrated with no relief of her dry mouth. She denies sore throat, fever and urinary frequency. She states [facility] for a possible STD and was prescribed antibiotics for bacterial vaginosis; she notes her results were negative for an STD.   Pt also complains of intermittent, vaginal spotting that began 1 week ago. She states she does not see any blood transferred on her underwear but notices it when she wipes. She denies hematuria, abnormal vaginal discharge and any new sexual partners. She notes she is sexually active with the same sexual partner; she states he was also tested last week and was negative for any STDs. She reports her last menstrual period ended around 8/15.    Past Medical History:  Diagnosis Date  . Medical history non-contributory     Patient Active Problem List   Diagnosis Date Noted  . Substance induced mood disorder (HCC) 08/13/2014  . Chronic tension headaches 06/09/2011  . DUB (dysfunctional uterine bleeding) 06/09/2011    Past Surgical History:  Procedure Laterality Date  . NO PAST SURGERIES      OB History    Gravida Para Term Preterm AB Living   1             SAB TAB  Ectopic Multiple Live Births                   Home Medications    Prior to Admission medications   Medication Sig Start Date End Date Taking? Authorizing Provider  albuterol (PROVENTIL HFA;VENTOLIN HFA) 108 (90 BASE) MCG/ACT inhaler Inhale 2 puffs into the lungs every 4 (four) hours as needed for wheezing or shortness of breath. 12/06/14  Yes Everlene Farrier, PA-C  cetirizine (ZYRTEC ALLERGY) 10 MG tablet Take 1 tablet (10 mg total) by mouth daily. Patient not taking: Reported on 12/02/2015 12/06/14   Everlene Farrier, PA-C  Prenatal Vit-Fe Fumarate-FA (PRENATAL VITAMIN) 27-0.8 MG TABS Take 1 tablet by mouth daily. Patient not taking: Reported on 12/02/2015 09/20/15   Duane Lope, NP  terconazole (TERAZOL 7) 0.4 % vaginal cream Place 1 applicator vaginally at bedtime. Patient not taking: Reported on 12/02/2015 10/01/15   Armando Reichert, CNM    Family History Family History  Problem Relation Age of Onset  . Depression Mother   . Asthma Mother   . Asthma Maternal Aunt   . Heart disease Maternal Grandfather   . Depression Paternal Grandmother     Social History Social History  Substance Use Topics  . Smoking status: Former Smoker    Packs/day: 0.15    Types: Cigarettes  . Smokeless tobacco: Never Used  . Alcohol use No     Allergies  Review of patient's allergies indicates no known allergies.   Review of Systems Review of Systems  Constitutional: Negative for fever.  HENT: Negative for sore throat.        Dry mouth  Genitourinary: Positive for vaginal bleeding. Negative for frequency, hematuria and vaginal discharge.  Ten systems reviewed and are negative for acute change, except as noted in the HPI.    Physical Exam Updated Vital Signs BP 132/73   Pulse (!) 57   Temp 98.4 F (36.9 C)   Resp 16   LMP 10/27/2015   SpO2 100%   Breastfeeding? Unknown   Physical Exam  Constitutional: She is oriented to person, place, and time. She appears well-developed and  well-nourished. No distress.  Nontoxic appearing  HENT:  Head: Normocephalic and atraumatic.  Thrush noted to tongue. Patient tolerating secretions without difficulty. Uvula midline. No trismus. No angioedema.  Eyes: Conjunctivae and EOM are normal. No scleral icterus.  Neck: Normal range of motion.  Cardiovascular: Normal rate, regular rhythm and intact distal pulses.   Pulmonary/Chest: Effort normal. No respiratory distress. She has no wheezes.  Respirations even and unlabored  Abdominal: Soft. She exhibits no distension and no mass. There is no tenderness. There is no guarding.  Soft, nontender abdomen.  Musculoskeletal: Normal range of motion.  Neurological: She is alert and oriented to person, place, and time.  Patient moving all extremities; ambulatory. GCS 15.  Skin: Skin is warm and dry. No rash noted. She is not diaphoretic. No erythema. No pallor.  Psychiatric: She has a normal mood and affect. Her behavior is normal.  Nursing note and vitals reviewed.    ED Treatments / Results  Labs (all labs ordered are listed, but only abnormal results are displayed) Labs Reviewed - No data to display  EKG  EKG Interpretation None       Radiology No results found.  Procedures Procedures (including critical care time)  Medications Ordered in ED Medications - No data to display   Initial Impression / Assessment and Plan / ED Course  I have reviewed the triage vital signs and the nursing notes.  Pertinent labs & imaging results that were available during my care of the patient were reviewed by me and considered in my medical decision making (see chart for details).  Clinical Course  DIAGNOSTIC STUDIES:  Oxygen Saturation is 100% on RA, normal by my interpretation.    COORDINATION OF CARE:  1:11 AM  Discussed treatment plan with pt at bedside and pt agreed to plan.  1:30 AM Patient with thrush in the setting of recent antibiotic use. Plan to treat with Nystatin.  Patient also complaining of vaginal "spotting"; however, this is only present when wiping. Patient denies any hematuria or dysuria. No other abnormal vaginal bleeding or discharge. Patient and her sexual partner were recently tested for STDs and were both negative. She has not engaged in sexual intercourse with any other partner. Doubt sexually transmitted infection or other emergent cause of discharge. Patient hemodynamically stable and afebrile. I do not believe further emergent workup is indicated. Patient discharged in stable condition.  I personally performed the services described in this documentation, which was scribed in my presence. The recorded information has been reviewed and is accurate.   Final Clinical Impressions(s) / ED Diagnoses   Final diagnoses:  Oral candidiasis    New Prescriptions Discharge Medication List as of 12/02/2015  1:14 AM    START taking these medications   Details  nystatin (MYCOSTATIN) 100000 UNIT/ML  suspension Take 5 mLs (500,000 Units total) by mouth 4 (four) times daily., Starting Wed 12/02/2015, Print         [place]istonKelly Gudelia Eugene, PA-C 12/02/15 0423    April Palumbo, MD 12/02/15 Jeralyn [id number]

## 2015-12-02 NOTE — ED Notes (Signed)
Pt has white patchy tongue. Dry mouth for 1 week.

## 2015-12-23 ENCOUNTER — Other Ambulatory Visit: Payer: Self-pay | Admitting: Obstetrics & Gynecology

## 2015-12-24 LAB — CYTOLOGY - PAP

## 2015-12-31 ENCOUNTER — Other Ambulatory Visit: Payer: Self-pay | Admitting: Obstetrics & Gynecology

## 2016-09-12 ENCOUNTER — Encounter (HOSPITAL_COMMUNITY): Payer: Self-pay

## 2016-09-12 ENCOUNTER — Emergency Department (HOSPITAL_COMMUNITY)
Admission: EM | Admit: 2016-09-12 | Discharge: 2016-09-12 | Disposition: A | Discharge status: Home / Self Care | Payer: Medicaid Other | Attending: Emergency Medicine | Admitting: Emergency Medicine

## 2016-09-12 DIAGNOSIS — H7291 Unspecified perforation of tympanic membrane, right ear: Secondary | ICD-10-CM | POA: Insufficient documentation

## 2016-09-12 DIAGNOSIS — Z87891 Personal history of nicotine dependence: Secondary | ICD-10-CM | POA: Insufficient documentation

## 2016-09-12 MED ORDER — OFLOXACIN 0.3 % OT SOLN: 5.0000 [drp] | Freq: Two times a day (BID) | OTIC | 0 refills | Status: AC
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
## 2016-09-12 NOTE — Discharge Instructions (Signed)
Please keep right ear dry-- no swimming, use cotton balls in vaseline for showering. Use Ofloxacin drops as prescribed for 5 days. Call to schedule a follow up appointment with your primary care doctor in 1 week. Please return to the ER if you experience fevers, chills, unexplained weight loss, dizziness, vision or gait changes, facial swelling, bleeding or drainage from ear, increased ear discomfort, headaches, neck pain, chest pain, shortness of breath, abdominal pain, nausea, vomiting, extremity weakness/numbness/tingling, worsening symptoms, or any additional concerns.

## 2017-11-09 ENCOUNTER — Other Ambulatory Visit: Payer: Self-pay

## 2017-11-09 ENCOUNTER — Encounter (HOSPITAL_COMMUNITY): Payer: Self-pay

## 2017-11-09 ENCOUNTER — Emergency Department (HOSPITAL_COMMUNITY): Payer: Self-pay

## 2017-11-09 ENCOUNTER — Emergency Department (HOSPITAL_COMMUNITY)
Admission: EM | Admit: 2017-11-09 | Discharge: 2017-11-09 | Disposition: A | Discharge status: Home / Self Care | Payer: Self-pay | Attending: Emergency Medicine | Admitting: Emergency Medicine

## 2017-11-09 DIAGNOSIS — M79672 Pain in left foot: Secondary | ICD-10-CM | POA: Insufficient documentation

## 2017-11-09 DIAGNOSIS — Z79899 Other long term (current) drug therapy: Secondary | ICD-10-CM | POA: Insufficient documentation

## 2017-11-09 DIAGNOSIS — Z87891 Personal history of nicotine dependence: Secondary | ICD-10-CM | POA: Insufficient documentation

## 2017-11-09 HISTORY — DX: Scoliosis, unspecified: M41.9

## 2017-11-09 MED ORDER — TRAMADOL HCL 50 MG PO TABS: 50.0000 mg | ORAL_TABLET | Freq: Four times a day (QID) | ORAL | 0 refills | Status: DC | PRN

## 2017-11-09 MED ORDER — IBUPROFEN 800 MG PO TABS: 800.0000 mg | ORAL_TABLET | Freq: Three times a day (TID) | ORAL | 0 refills | Status: DC | PRN

## 2017-11-09 NOTE — Discharge Instructions (Signed)
Return here as needed. Ice and heat to the foot. Follow up closely with the orthopedist provided.

## 2017-11-09 NOTE — ED Triage Notes (Signed)
Patient c/o left foot pain x 2 months. Patient states when she walks bare foot the pain is worse.  Patient c/o pain along the spine x 1 month. Patient states a history of scoliosis.

## 2017-11-09 NOTE — ED Notes (Signed)
Patient refused recheck on vs and to wait for AVS. Pt a/o x4 and ambulatory upon discharge.

## 2017-11-12 NOTE — ED Provider Notes (Signed)
[facility] Provider Note   CSN: [id number] Arrival date & time: 11/09/17  1000     History   Chief Complaint Chief Complaint  Patient presents with  . Foot Pain  . Back Pain    HPI Jennifer Campbell is a 27 y.o. female.  HPI Patient presents to the emergency department with a 40-month history of foot pain at the heel on the left.  The patient states that when she walks barefooted the pain is worse.  She states that certain movements palpation make the pain worse.  Patient states that nothing seems to make the condition better.  She states that she did not take any medications prior to arrival her symptoms.  She denies any injuries to the foot.  She denies any numbness or weakness in the foot. Past Medical History:  Diagnosis Date  . Medical history non-contributory   . Scoliosis     Patient Active Problem List   Diagnosis Date Noted  . Substance induced mood disorder (HCC) 08/13/2014  . Chronic tension headaches 06/09/2011  . DUB (dysfunctional uterine bleeding) 06/09/2011    Past Surgical History:  Procedure Laterality Date  . NO PAST SURGERIES       OB History    Gravida  1   Para      Term      Preterm      AB      Living        SAB      TAB      Ectopic      Multiple      Live Births               Home Medications    Prior to Admission medications   Medication Sig Start Date End Date Taking? Authorizing Provider  naproxen sodium (ALEVE) 220 MG tablet Take 220 mg by mouth daily as needed (pain).   Yes [provider]  albuterol (PROVENTIL HFA;VENTOLIN HFA) 108 (90 BASE) MCG/ACT inhaler Inhale 2 puffs into the lungs every 4 (four) hours as needed for wheezing or shortness of breath. 12/06/14   Everlene Farrier, PA-C  cetirizine (ZYRTEC ALLERGY) 10 MG tablet Take 1 tablet (10 mg total) by mouth daily. Patient not taking: Reported on 12/02/2015 12/06/14   Everlene Farrier, PA-C  ibuprofen  (ADVIL,MOTRIN) 800 MG tablet Take 1 tablet (800 mg total) by mouth every 8 (eight) hours as needed. 11/09/17   Sharona Rovner, Cristal Deer, PA-C  nystatin (MYCOSTATIN) 100000 UNIT/ML suspension Take 5 mLs (500,000 Units total) by mouth 4 (four) times daily. Patient not taking: Reported on 11/09/2017 12/02/15   Antony Madura, PA-C  Prenatal Vit-Fe Fumarate-FA (PRENATAL VITAMIN) 27-0.8 MG TABS Take 1 tablet by mouth daily. Patient not taking: Reported on 12/02/2015 09/20/15   Rasch, Victorino Dike I, NP  terconazole (TERAZOL 7) 0.4 % vaginal cream Place 1 applicator vaginally at bedtime. Patient not taking: Reported on 12/02/2015 10/01/15   Armando Reichert, CNM  traMADol (ULTRAM) 50 MG tablet Take 1 tablet (50 mg total) by mouth every 6 (six) hours as needed for severe pain. 11/09/17   Charlestine Night, PA-C    Family History Family History  Problem Relation Age of Onset  . Depression Mother   . Asthma Mother   . Asthma Maternal Aunt   . Heart disease Maternal Grandfather   . Depression Paternal Grandmother     Social History Social History   Tobacco Use  . Smoking status: Former Smoker  Packs/day: 0.15    Types: Cigarettes  . Smokeless tobacco: Never Used  Substance Use Topics  . Alcohol use: No  . Drug use: No    Comment: THC     Allergies   Patient has no known allergies.   Review of Systems Review of Systems All other systems negative except as documented in the HPI. All pertinent positives and negatives as reviewed in the HPI.  Physical Exam Updated Vital Signs BP 120/69   Pulse 83   Temp 98.4 F (36.9 C)   Resp 16   Ht 5\' 5"  (1.651 m)   Wt 59 kg   LMP 10/19/2017   SpO2 99%   BMI 21.63 kg/m   Physical Exam  Constitutional: She is oriented to person, place, and time. She appears well-developed and well-nourished. No distress.  HENT:  Head: Normocephalic and atraumatic.  Eyes: Pupils are equal, round, and reactive to light.  Pulmonary/Chest: Effort normal.    Musculoskeletal:       Feet:  Neurological: She is alert and oriented to person, place, and time.  Skin: Skin is warm and dry.  Psychiatric: She has a normal mood and affect.  Nursing note and vitals reviewed.    ED Treatments / Results  Labs (all labs ordered are listed, but only abnormal results are displayed) Labs Reviewed - No data to display  EKG None  Radiology No results found.  Procedures Procedures (including critical care time)  Medications Ordered in ED Medications - No data to display   Initial Impression / Assessment and Plan / ED Course  I have reviewed the triage vital signs and the nursing notes.  Pertinent labs & imaging results that were available during my care of the patient were reviewed by me and considered in my medical decision making (see chart for details).     Referred the patient to orthopedics for further evaluation and care.  Patient is also advised to ice and elevate the foot.  She refused postop shoe.  Final Clinical Impressions(s) / ED Diagnoses   Final diagnoses:  Foot pain, left    ED Discharge Orders         Ordered    traMADol (ULTRAM) 50 MG tablet  Every 6 hours PRN     11/09/17 1335    ibuprofen (ADVIL,MOTRIN) 800 MG tablet  Every 8 hours PRN     11/09/17 1335           LawyerCristal Deer, PA-C 11/12/17 1633    Melene Plan, DO 11/13/17 0720

## 2017-11-27 ENCOUNTER — Emergency Department (HOSPITAL_COMMUNITY): Payer: No Typology Code available for payment source

## 2017-11-27 ENCOUNTER — Encounter (HOSPITAL_COMMUNITY): Payer: Self-pay | Admitting: Emergency Medicine

## 2017-11-27 ENCOUNTER — Emergency Department (HOSPITAL_COMMUNITY)
Admission: EM | Admit: 2017-11-27 | Discharge: 2017-11-27 | Disposition: A | Discharge status: Home / Self Care | Payer: No Typology Code available for payment source | Attending: Emergency Medicine | Admitting: Emergency Medicine

## 2017-11-27 ENCOUNTER — Other Ambulatory Visit: Payer: Self-pay

## 2017-11-27 DIAGNOSIS — Y92410 Unspecified street and highway as the place of occurrence of the external cause: Secondary | ICD-10-CM | POA: Diagnosis not present

## 2017-11-27 DIAGNOSIS — Z87891 Personal history of nicotine dependence: Secondary | ICD-10-CM | POA: Insufficient documentation

## 2017-11-27 DIAGNOSIS — M542 Cervicalgia: Secondary | ICD-10-CM | POA: Diagnosis not present

## 2017-11-27 DIAGNOSIS — M545 Low back pain: Secondary | ICD-10-CM | POA: Insufficient documentation

## 2017-11-27 DIAGNOSIS — M549 Dorsalgia, unspecified: Secondary | ICD-10-CM | POA: Diagnosis not present

## 2017-11-27 DIAGNOSIS — Y999 Unspecified external cause status: Secondary | ICD-10-CM | POA: Diagnosis not present

## 2017-11-27 DIAGNOSIS — R2231 Localized swelling, mass and lump, right upper limb: Secondary | ICD-10-CM | POA: Insufficient documentation

## 2017-11-27 DIAGNOSIS — Y9389 Activity, other specified: Secondary | ICD-10-CM | POA: Diagnosis not present

## 2017-11-27 DIAGNOSIS — Z79899 Other long term (current) drug therapy: Secondary | ICD-10-CM | POA: Diagnosis not present

## 2017-11-27 DIAGNOSIS — R51 Headache: Secondary | ICD-10-CM | POA: Insufficient documentation

## 2017-11-27 LAB — POC URINE PREG, ED: PREG TEST UR: NEGATIVE

## 2017-11-27 MED ORDER — NAPROXEN 500 MG PO TABS: 500.0000 mg | ORAL_TABLET | Freq: Two times a day (BID) | ORAL | 0 refills | Status: DC

## 2017-11-27 MED ORDER — METHOCARBAMOL 500 MG PO TABS: 500.0000 mg | ORAL_TABLET | Freq: Three times a day (TID) | ORAL | 0 refills | Status: DC | PRN

## 2017-11-27 MED ORDER — KETOROLAC TROMETHAMINE 15 MG/ML IJ SOLN: 15.0000 mg | Freq: Once | INTRAMUSCULAR | Status: DC

## 2017-11-27 MED ORDER — METHOCARBAMOL 500 MG PO TABS
500.0000 mg | ORAL_TABLET | Freq: Once | ORAL | Status: AC
  Administered 2017-11-27: 500 mg via ORAL
  Filled 2017-11-27: qty 1

## 2017-11-27 MED ORDER — KETOROLAC TROMETHAMINE 60 MG/2ML IM SOLN
60.0000 mg | Freq: Once | INTRAMUSCULAR | Status: AC
  Administered 2017-11-27: 60 mg via INTRAMUSCULAR
  Filled 2017-11-27: qty 2
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
## 2017-11-27 NOTE — ED Notes (Signed)
Patient refusing wrist XR and requesting d/c paperwork. PA made aware.
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
## 2017-11-27 NOTE — ED Notes (Signed)
Patient provided with mouth swabs. 
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
## 2017-11-27 NOTE — ED Notes (Signed)
Patient reports she noticed "new knot" to right hand. PA made aware.

## 2017-11-27 NOTE — ED Notes (Signed)
Patient transported to CT 
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
## 2017-12-01 ENCOUNTER — Encounter (HOSPITAL_COMMUNITY): Payer: Self-pay

## 2017-12-01 ENCOUNTER — Emergency Department (HOSPITAL_COMMUNITY)
Admission: EM | Admit: 2017-12-01 | Discharge: 2017-12-01 | Disposition: A | Discharge status: Home / Self Care | Payer: No Typology Code available for payment source | Attending: Emergency Medicine | Admitting: Emergency Medicine

## 2017-12-01 ENCOUNTER — Other Ambulatory Visit: Payer: Self-pay

## 2017-12-01 DIAGNOSIS — M62838 Other muscle spasm: Secondary | ICD-10-CM | POA: Diagnosis not present

## 2017-12-01 DIAGNOSIS — M436 Torticollis: Secondary | ICD-10-CM

## 2017-12-01 DIAGNOSIS — M542 Cervicalgia: Secondary | ICD-10-CM | POA: Diagnosis present

## 2017-12-01 MED ORDER — KETOROLAC TROMETHAMINE 15 MG/ML IJ SOLN
60.0000 mg | Freq: Once | INTRAMUSCULAR | Status: AC
Start: 2017-12-01 — End: 2017-12-01
  Administered 2017-12-01: 60 mg via INTRAMUSCULAR
  Filled 2017-12-01: qty 4

## 2017-12-01 MED ORDER — CYCLOBENZAPRINE HCL 10 MG PO TABS
10.0000 mg | ORAL_TABLET | Freq: Once | ORAL | Status: AC
  Administered 2017-12-01: 10 mg via ORAL
  Filled 2017-12-01: qty 1

## 2017-12-01 MED ORDER — CYCLOBENZAPRINE HCL 10 MG PO TABS: 10.0000 mg | ORAL_TABLET | Freq: Two times a day (BID) | ORAL | 0 refills | Status: DC | PRN

## 2017-12-01 NOTE — Discharge Instructions (Signed)
Use heating pad, muscle rub like tiger balm, icy hot to help with spasm and pain.  Avoid ice.
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
## 2018-02-16 ENCOUNTER — Encounter (HOSPITAL_COMMUNITY): Payer: Self-pay | Admitting: *Deleted

## 2018-02-16 ENCOUNTER — Emergency Department (HOSPITAL_COMMUNITY): Payer: Self-pay

## 2018-02-16 ENCOUNTER — Emergency Department (HOSPITAL_COMMUNITY)
Admission: EM | Admit: 2018-02-16 | Discharge: 2018-02-16 | Disposition: A | Discharge status: Home / Self Care | Payer: Self-pay | Attending: Emergency Medicine | Admitting: Emergency Medicine

## 2018-02-16 DIAGNOSIS — Z87891 Personal history of nicotine dependence: Secondary | ICD-10-CM | POA: Insufficient documentation

## 2018-02-16 DIAGNOSIS — B309 Viral conjunctivitis, unspecified: Secondary | ICD-10-CM | POA: Insufficient documentation

## 2018-02-16 DIAGNOSIS — M25521 Pain in right elbow: Secondary | ICD-10-CM | POA: Insufficient documentation

## 2018-02-16 DIAGNOSIS — M67431 Ganglion, right wrist: Secondary | ICD-10-CM | POA: Insufficient documentation

## 2018-02-16 LAB — POC URINE PREG, ED: Preg Test, Ur: NEGATIVE

## 2018-02-16 MED ORDER — ERYTHROMYCIN 5 MG/GM OP OINT: TOPICAL_OINTMENT | OPHTHALMIC | 0 refills | Status: DC

## 2018-02-16 NOTE — ED Notes (Signed)
Patient able to ambulate independently  

## 2018-02-16 NOTE — ED Notes (Signed)
Patient ambulated independently to bathroom, gait steady and even

## 2018-02-16 NOTE — Discharge Instructions (Addendum)
You have been diagnosed today with ganglion cyst of the right wrist, right elbow pain, viral conjunctivitis.   At this time there does not appear to be the presence of an emergent medical condition, however there is always the potential for conditions to change. Please read and follow the below instructions.  Please return to the Emergency Department immediately for any new or worsening symptoms. Please be sure to follow up with your Primary Care Provider next week regarding your visit today; please call their office to schedule an appointment even if you are feeling better for a follow-up visit. Please follow-up with the hand specialist Dr. Orlan Leavensrtman for further evaluation possible removal of your right ganglion cyst.  You may also discuss your right elbow pain at the orthopedist office. It is likely that your eye infection today is viral nature however please use the antibiotic erythromycin as prescribed prophylaxis of bacterial infection.  Please follow-up with your primary care provider for further evaluation next week.  Contact a health care provider if: Your ganglion cyst becomes larger or more painful. You have increased redness, red streaks, or swelling. You have pus coming from the lump. You have weakness or numbness in the affected area. You have a fever or chills. Contact a health care provider if: Your symptoms do not improve with treatment or they get worse. You have increased pain. Your vision becomes blurry. You have a fever. You have facial pain, redness, or swelling. You have yellow or green drainage coming from your eye. You have new symptoms.  Please read the additional information packets attached to your discharge summary.  Do not take your medicine if  develop an itchy rash, swelling in your mouth or lips, or difficulty breathing.

## 2018-02-16 NOTE — ED Provider Notes (Signed)
[facility] EMERGENCY DEPARTMENT Provider Note   CSN: [id number] Arrival date & time: 02/16/18  1059     History   Chief Complaint No chief complaint on file.   HPI Jennifer Campbell is a 27 y.o. female presents today for multiple complaints.  Patient's initial complaint is of a cyst on the dorsum of her right wrist, patient states that it has been there for greater than 1 month and it does not cause her pain.  Patient states that she noticed the area gradually grew to its current state over the past month.  She denies fever, redness, pain to the area or pain with wrist movement.  Patient states that she has never had this problem before.  Additionally patient endorses a 2-week history of right elbow pain, mild intensity, aching and worse with movement.  Patient has not taken any medication for her pain.  Patient denies known injury to the area.  Patient denies swelling, numbness tingling or weakness.  Finally patient endorses 1 day history of red/itching eyes.  Patient states that her child has been recently diagnosed with conjunctivitis and feels that she may also be experiencing the same symptoms.  Patient denies visual changes, photophobia or pain with ocular motion.  Patient endorses a clear discharge from her eyes and feels that her eyelashes are clumped together in the morning.  Patient has not taken medication for the symptoms either.  HPI  Past Medical History:  Diagnosis Date  . Medical history non-contributory   . Scoliosis     Patient Active Problem List   Diagnosis Date Noted  . Substance induced mood disorder (HCC) 08/13/2014  . Chronic tension headaches 06/09/2011  . DUB (dysfunctional uterine bleeding) 06/09/2011    Past Surgical History:  Procedure Laterality Date  . NO PAST SURGERIES       OB History    Gravida  1   Para      Term      Preterm      AB      Living        SAB      TAB      Ectopic      Multiple      Live Births               Home Medications    Prior to Admission medications   Medication Sig Start Date End Date Taking? Authorizing Provider  albuterol (PROVENTIL HFA;VENTOLIN HFA) 108 (90 BASE) MCG/ACT inhaler Inhale 2 puffs into the lungs every 4 (four) hours as needed for wheezing or shortness of breath. 12/06/14   Everlene Farrier, PA-C  cyclobenzaprine (FLEXERIL) 10 MG tablet Take 1 tablet (10 mg total) by mouth 2 (two) times daily as needed for muscle spasms. 12/01/17   Gwyneth Sprout, MD  erythromycin ophthalmic ointment Place a 1/2 inch ribbon of ointment into the lower eyelid. 02/16/18   Harlene Salts A, PA-C  methocarbamol (ROBAXIN) 500 MG tablet Take 1 tablet (500 mg total) by mouth every 8 (eight) hours as needed for muscle spasms. 11/27/17   Petrucelli, Samantha R, PA-C  naproxen (NAPROSYN) 500 MG tablet Take 1 tablet (500 mg total) by mouth 2 (two) times daily. 11/27/17   Petrucelli, Pleas Koch, PA-C    Family History Family History  Problem Relation Age of Onset  . Depression Mother   . Asthma Mother   . Asthma Maternal Aunt   . Heart disease Maternal Grandfather   . Depression Paternal Grandmother  Social History Social History   Tobacco Use  . Smoking status: Former Smoker    Packs/day: 0.15    Types: Cigarettes  . Smokeless tobacco: Never Used  Substance Use Topics  . Alcohol use: No  . Drug use: No    Comment: THC     Allergies   Patient has no known allergies.   Review of Systems Review of Systems  Constitutional: Negative.  Negative for activity change, appetite change, chills, fatigue and fever.  HENT: Positive for congestion and rhinorrhea. Negative for drooling, facial swelling, sore throat, trouble swallowing and voice change.   Eyes: Positive for discharge and itching. Negative for photophobia, pain and visual disturbance.  Musculoskeletal: Positive for arthralgias (Right elbow) and joint swelling (Cyst to right wrist). Negative for  neck pain and neck stiffness.  Skin: Negative.  Negative for color change, rash and wound.  Neurological: Negative.  Negative for dizziness, weakness, light-headedness and numbness.     Physical Exam Updated Vital Signs BP 126/77   Pulse (!) 53   Temp 99 F (37.2 C) (Oral)   Resp 16   Ht 5\' 4"  (1.626 m)   Wt 63.5 kg   LMP 02/16/2018 (Approximate)   SpO2 98%   Breastfeeding? No   BMI 24.03 kg/m   Physical Exam  Constitutional: She appears well-developed and well-nourished. She does not appear ill. No distress.  HENT:  Head: Normocephalic and atraumatic.  Right Ear: External ear normal.  Left Ear: External ear normal.  Nose: Rhinorrhea present.  Mouth/Throat: Uvula is midline, oropharynx is clear and moist and mucous membranes are normal.  The patient has normal phonation and is in control of secretions. No stridor.  Midline uvula without edema. Soft palate rises symmetrically. No tonsillar erythema, swelling or exudates. Tongue protrusion is normal, floor of mouth is soft. No trismus. No creptius on neck palpation. No gingival erythema or fluctuance noted. Mucus membranes moist.  Eyes: Pupils are equal, round, and reactive to light. EOM are normal.  Bilateral eyes with mild conjunctival erythema.  No scleral icterus.  No discharge.  Pupils equal round reactive to light and accommodating.  No pain with extraocular motion.  No nystagmus.  No photophobia or consensual photophobia.  No gross foreign body.   Neck: Trachea normal, normal range of motion, full passive range of motion without pain and phonation normal. Neck supple. No tracheal tenderness present. No tracheal deviation present.  Cardiovascular: Normal rate, regular rhythm and normal heart sounds.  Pulses:      Radial pulses are 2+ on the right side, and 2+ on the left side.  Pulmonary/Chest: Effort normal and breath sounds normal. No respiratory distress. She exhibits no tenderness, no crepitus and no deformity.    Abdominal: Soft. There is no tenderness. There is no rebound and no guarding.  Musculoskeletal: Normal range of motion.       Cervical back: Normal.  Right hand: Patient with approximately 2 cm fluctuant cyst present to the dorsum of the right wrist, skin intact.  No surrounding erythema, no increased warmth, no induration, no drainage.  Fingers appear normal.  No tenderness to palpation of the right wrist or cyst. No snuffbox tenderness to palpation. No tenderness to palpation over flexor sheath.  Finger adduction/abduction intact with 5/5 strength.  Thumb opposition intact. Full active and resisted ROM to flexion/extension at wrist, MCP, PIP and DIP of all fingers.  FDS/FDP intact. Grip 5/5 strength.  Radial artery 2+ with <2sec cap refill in all fingers.  Sensation  intact to light-tough in median/ulnar/radial distributions.   Right Shoulder: Appearance normal. No obvious bony deformity. No skin swelling, erythema, heat, fluctuance or break of the skin. No clavicular deformity or TTP.  No tenderness to palpation of the shoulder. Active and passive flexion, extension, abduction, adduction, and internal/external rotation intact without pain or crepitus. Strength for flexion, extension, abduction, adduction, and internal/external rotation intact and appropriate for age.  Right Elbow: Appearance normal. No obvious bony deformity. No skin swelling, erythema, heat, fluctuance or break of the skin. No TTP over joint. Active flexion, extension, supination and pronation full and intact.  Patient reports some increased pain with full flexion of the elbow.  Strength able and appropriate for age for flexion and extension.   Radial Pulse 2+. Cap refill <2 seconds. SILT for M/U/R distributions. Compartments soft.     Neurological: She is alert. GCS eye subscore is 4. GCS verbal subscore is 5. GCS motor subscore is 6.  Speech is clear and goal oriented, follows commands Major Cranial nerves without deficit,  no facial droop Normal strength in upper and lower extremities bilaterally including dorsiflexion and plantar flexion, strong and equal grip strength Sensation normal to light touch Moves extremities without ataxia, coordination intact Normal gait  Skin: Skin is warm and dry.  Psychiatric: She has a normal mood and affect. Her behavior is normal.   ED Treatments / Results  Labs (all labs ordered are listed, but only abnormal results are displayed) Labs Reviewed  POC URINE PREG, ED    EKG None  Radiology Dg Elbow Complete Right  Result Date: 02/16/2018 CLINICAL DATA:  27 year old female with a 1 month history of right wrist and elbow pain. No known injury. EXAM: RIGHT ELBOW - COMPLETE 3+ VIEW COMPARISON:  None. FINDINGS: There is no evidence of fracture, dislocation, or joint effusion. There is no evidence of arthropathy or other focal bone abnormality. Soft tissues are unremarkable. IMPRESSION: Negative. Electronically Signed   By: Malachy Moan M.D.   On: 02/16/2018 13:44   Dg Wrist Complete Right  Result Date: 02/16/2018 CLINICAL DATA:  Right wrist pain. EXAM: RIGHT WRIST - COMPLETE 3+ VIEW COMPARISON:  None. FINDINGS: There is no evidence of fracture or dislocation. There is no evidence of arthropathy or other focal bone abnormality. Subcutaneous intermediate density 1.9 cm soft tissue mass versus cyst is seen within dorsum of the wrist. IMPRESSION: No acute fracture or dislocation identified about the right wrist. Subcutaneous 1.9 cm soft tissue mass versus cyst in the dorsum of the wrist. Electronically Signed   By: Ted Mcalpine M.D.   On: 02/16/2018 13:46    Procedures Procedures (including critical care time)  Medications Ordered in ED Medications - No data to display   Initial Impression / Assessment and Plan / ED Course  I have reviewed the triage vital signs and the nursing notes.  Pertinent labs & imaging results that were available during my care of the  patient were reviewed by me and considered in my medical decision making (see chart for details).    27 year old otherwise healthy female with presentation consistent with viral conjunctivitis.  No purulent discharge, entrapment, consensual photophobia.  Presentation non-concerning for iritis, bacterial conjunctivitis, corneal abrasions.  Patient with family member also recently diagnosed with viral conjunctivitis.  Will prescribe erythromycin ointment due to concern for development of bacterial conjunctivitis.  Personal hygiene and frequent handwashing discussed.  Patient advised to followup with primary care provider tomorrow and return to the ED for new or worsening symptoms  including vision change or purulent discharge.  Patient verbalizes understanding and is agreeable with discharge.  Based on history and presentation do not believe fluorescein examination is warranted at this time, patient agrees.  Additionally patient with right elbow pain, atraumatic, pain only with full flexion, imaging today negative for acute findings. Extremity neurovascularly intact; no signs of infection, septic joint, DVT, compartment syndrome. Patient with Full ROM and 5/5 strength with all movements.  Suspect muscular pain today.  Patient has been given orthopedic follow-up.  Finally patient here today with presentation consistent with ganglion cyst right wrist.  Does not appear infected, no erythema, increased warmth, drainage or pain with motion of the wrist.  No injury identified within the wrist on plain film today.  No signs of infection, septic joint, DVT, compartment syndrome patient is neurovascular intact with full range of motion 5/5 strength to the right hand.  Patient has been given referral today to orthopedics for further evaluation and possible removal of ganglion cyst.  At discharge patient is afebrile, not tachycardic, not hypotensive, well-appearing and in no acute distress.  At this time there does not  appear to be any evidence of an acute emergency medical condition and the patient appears stable for discharge with appropriate outpatient follow up. Diagnosis was discussed with patient who verbalizes understanding of care plan and is agreeable to discharge. I have discussed return precautions with patient who verbalize understanding of return precautions. Patient strongly encouraged to follow-up with their PCP within one week and to call hand today to schedule an appointment for next week. All questions answered.  Patient's case discussed with Dr. Adriana Simasook who agrees with plan to discharge with hand follow-up.   Note: Portions of this report may have been transcribed using voice recognition software. Every effort was made to ensure accuracy; however, inadvertent computerized transcription errors may still be present.  Final Clinical Impressions(s) / ED Diagnoses   Final diagnoses:  Ganglion cyst of dorsum of right wrist  Right elbow pain  Viral conjunctivitis of both eyes    ED Discharge Orders         Ordered    erythromycin ophthalmic ointment     02/16/18 1358           Elizabeth PalauMorelli, Yama Nielson A, PA-C 02/16/18 1435    Donnetta Hutchingook, Brian, MD 02/17/18 [phone]

## 2018-02-16 NOTE — ED Triage Notes (Signed)
Pt in c/o R arm pain with R cyst to R hand onset x 1 mth, pt reports redness and pain to R eye with known family member with dx of pink eye, a&o X4

## 2018-08-12 ENCOUNTER — Emergency Department (HOSPITAL_COMMUNITY)
Admission: EM | Admit: 2018-08-12 | Discharge: 2018-08-12 | Disposition: A | Discharge status: Home / Self Care | Payer: No Typology Code available for payment source | Attending: Emergency Medicine | Admitting: Emergency Medicine

## 2018-08-12 ENCOUNTER — Emergency Department (HOSPITAL_COMMUNITY): Payer: No Typology Code available for payment source

## 2018-08-12 ENCOUNTER — Encounter (HOSPITAL_COMMUNITY): Payer: Self-pay | Admitting: Emergency Medicine

## 2018-08-12 ENCOUNTER — Other Ambulatory Visit: Payer: Self-pay

## 2018-08-12 DIAGNOSIS — Z87891 Personal history of nicotine dependence: Secondary | ICD-10-CM | POA: Diagnosis not present

## 2018-08-12 DIAGNOSIS — S0990XA Unspecified injury of head, initial encounter: Secondary | ICD-10-CM | POA: Insufficient documentation

## 2018-08-12 DIAGNOSIS — T07XXXA Unspecified multiple injuries, initial encounter: Secondary | ICD-10-CM

## 2018-08-12 DIAGNOSIS — S61411A Laceration without foreign body of right hand, initial encounter: Secondary | ICD-10-CM | POA: Diagnosis not present

## 2018-08-12 DIAGNOSIS — Y92410 Unspecified street and highway as the place of occurrence of the external cause: Secondary | ICD-10-CM | POA: Insufficient documentation

## 2018-08-12 DIAGNOSIS — R10819 Abdominal tenderness, unspecified site: Secondary | ICD-10-CM | POA: Diagnosis not present

## 2018-08-12 DIAGNOSIS — Y939 Activity, unspecified: Secondary | ICD-10-CM | POA: Insufficient documentation

## 2018-08-12 DIAGNOSIS — Z23 Encounter for immunization: Secondary | ICD-10-CM | POA: Diagnosis not present

## 2018-08-12 DIAGNOSIS — S51021A Laceration with foreign body of right elbow, initial encounter: Secondary | ICD-10-CM | POA: Diagnosis not present

## 2018-08-12 DIAGNOSIS — T148XXA Other injury of unspecified body region, initial encounter: Secondary | ICD-10-CM

## 2018-08-12 DIAGNOSIS — M79641 Pain in right hand: Secondary | ICD-10-CM | POA: Diagnosis present

## 2018-08-12 LAB — CBC WITH DIFFERENTIAL/PLATELET
Abs Immature Granulocytes: 0.04 10*3/uL (ref 0.00–0.07)
Basophils Absolute: 0 10*3/uL (ref 0.0–0.1)
Basophils Relative: 0 %
Eosinophils Absolute: 0.1 10*3/uL (ref 0.0–0.5)
Eosinophils Relative: 2 %
HCT: 40 % (ref 36.0–46.0)
Hemoglobin: 13.4 g/dL (ref 12.0–15.0)
Immature Granulocytes: 0 %
Lymphocytes Relative: 23 %
Lymphs Abs: 2.2 10*3/uL (ref 0.7–4.0)
MCH: 32.1 pg (ref 26.0–34.0)
MCHC: 33.5 g/dL (ref 30.0–36.0)
MCV: 95.7 fL (ref 80.0–100.0)
Monocytes Absolute: 0.5 10*3/uL (ref 0.1–1.0)
Monocytes Relative: 5 %
Neutro Abs: 6.6 10*3/uL (ref 1.7–7.7)
Neutrophils Relative %: 70 %
Platelets: 336 10*3/uL (ref 150–400)
RBC: 4.18 MIL/uL (ref 3.87–5.11)
RDW: 12.2 % (ref 11.5–15.5)
WBC: 9.5 10*3/uL (ref 4.0–10.5)
nRBC: 0 % (ref 0.0–0.2)

## 2018-08-12 LAB — COMPREHENSIVE METABOLIC PANEL
ALT: 16 U/L (ref 0–44)
AST: 23 U/L (ref 15–41)
Albumin: 4.3 g/dL (ref 3.5–5.0)
Alkaline Phosphatase: 66 U/L (ref 38–126)
Anion gap: 12 (ref 5–15)
BUN: 5 mg/dL — ABNORMAL LOW (ref 6–20)
CO2: 25 mmol/L (ref 22–32)
Calcium: 9.6 mg/dL (ref 8.9–10.3)
Chloride: 104 mmol/L (ref 98–111)
Creatinine, Ser: 0.99 mg/dL (ref 0.44–1.00)
GFR calc Af Amer: >60 mL/min (ref >60)
GFR calc non Af Amer: >60 mL/min (ref >60)
Glucose, Bld: 78 mg/dL (ref 70–99)
Potassium: 3.4 mmol/L — ABNORMAL LOW (ref 3.5–5.1)
Sodium: 141 mmol/L (ref 135–145)
Total Bilirubin: 0.4 mg/dL (ref 0.3–1.2)
Total Protein: 7.9 g/dL (ref 6.5–8.1)

## 2018-08-12 LAB — I-STAT BETA HCG BLOOD, ED (MC, WL, AP ONLY): I-stat hCG, quantitative: <5 m[IU]/mL (ref <5)

## 2018-08-12 MED ORDER — IOHEXOL 300 MG/ML  SOLN
100.0000 mL | Freq: Once | INTRAMUSCULAR | Status: AC | PRN
  Administered 2018-08-12: 11:00:00 100 mL via INTRAVENOUS

## 2018-08-12 MED ORDER — BACITRACIN ZINC 500 UNIT/GM EX OINT: 1.0000 "application " | TOPICAL_OINTMENT | Freq: Two times a day (BID) | CUTANEOUS | 0 refills | Status: DC

## 2018-08-12 MED ORDER — DOXYCYCLINE HYCLATE 100 MG PO TABS
100.0000 mg | ORAL_TABLET | Freq: Once | ORAL | Status: AC
  Administered 2018-08-12: 11:00:00 100 mg via ORAL
  Filled 2018-08-12: qty 1

## 2018-08-12 MED ORDER — BACITRACIN ZINC 500 UNIT/GM EX OINT
TOPICAL_OINTMENT | Freq: Two times a day (BID) | CUTANEOUS | Status: DC
  Administered 2018-08-12: 1 via TOPICAL
  Filled 2018-08-12: qty 1.8

## 2018-08-12 MED ORDER — HYDROCODONE-ACETAMINOPHEN 5-325 MG PO TABS
1.0000 | ORAL_TABLET | Freq: Once | ORAL | Status: AC
  Administered 2018-08-12: 11:00:00 1 via ORAL
  Filled 2018-08-12: qty 1

## 2018-08-12 MED ORDER — DOXYCYCLINE HYCLATE 100 MG PO CAPS: 100.0000 mg | ORAL_CAPSULE | Freq: Two times a day (BID) | ORAL | 0 refills | Status: DC

## 2018-08-12 MED ORDER — TETANUS-DIPHTH-ACELL PERTUSSIS 5-2.5-18.5 LF-MCG/0.5 IM SUSP
0.5000 mL | Freq: Once | INTRAMUSCULAR | Status: AC
  Administered 2018-08-12: 11:00:00 0.5 mL via INTRAMUSCULAR
  Filled 2018-08-12: qty 0.5

## 2018-08-12 MED ORDER — ACETAMINOPHEN 325 MG PO TABS: 650.0000 mg | ORAL_TABLET | Freq: Four times a day (QID) | ORAL | 0 refills | Status: DC | PRN

## 2018-08-12 MED ORDER — HYDROCODONE-ACETAMINOPHEN 5-325 MG PO TABS
1.0000 | ORAL_TABLET | Freq: Once | ORAL | Status: AC
  Administered 2018-08-12: 14:00:00 1 via ORAL
  Filled 2018-08-12: qty 1

## 2018-08-12 MED ORDER — MORPHINE SULFATE (PF) 4 MG/ML IV SOLN
6.0000 mg | Freq: Once | INTRAVENOUS | Status: AC
  Administered 2018-08-12: 6 mg via INTRAVENOUS
  Filled 2018-08-12: qty 2

## 2018-08-12 MED ORDER — NAPROXEN 375 MG PO TABS: 375.0000 mg | ORAL_TABLET | Freq: Two times a day (BID) | ORAL | 0 refills | Status: DC

## 2018-08-12 NOTE — ED Notes (Signed)
RN went over discharge instructions pt has no questions

## 2018-08-12 NOTE — ED Notes (Signed)
Pt complains of pain to the entire right side of her body with a laceration and abrasions to right arm.
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
## 2018-08-24 ENCOUNTER — Other Ambulatory Visit: Payer: Self-pay

## 2018-08-24 ENCOUNTER — Encounter (HOSPITAL_COMMUNITY): Payer: Self-pay | Admitting: Emergency Medicine

## 2018-08-24 ENCOUNTER — Emergency Department (HOSPITAL_COMMUNITY)
Admission: EM | Admit: 2018-08-24 | Discharge: 2018-08-24 | Disposition: A | Discharge status: Home / Self Care | Payer: Self-pay | Attending: Emergency Medicine | Admitting: Emergency Medicine

## 2018-08-24 DIAGNOSIS — Z5321 Procedure and treatment not carried out due to patient leaving prior to being seen by health care provider: Secondary | ICD-10-CM | POA: Insufficient documentation

## 2018-08-24 DIAGNOSIS — M79641 Pain in right hand: Secondary | ICD-10-CM | POA: Insufficient documentation
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
## 2018-08-24 NOTE — ED Notes (Signed)
Bed: WTR7 Expected date:  Expected time:  Means of arrival:  Comments: 

## 2018-08-29 ENCOUNTER — Other Ambulatory Visit: Payer: Self-pay

## 2018-08-29 ENCOUNTER — Encounter (HOSPITAL_COMMUNITY): Payer: Self-pay

## 2018-08-29 ENCOUNTER — Emergency Department (HOSPITAL_COMMUNITY): Payer: No Typology Code available for payment source

## 2018-08-29 ENCOUNTER — Emergency Department (HOSPITAL_COMMUNITY)
Admission: EM | Admit: 2018-08-29 | Discharge: 2018-08-29 | Disposition: A | Discharge status: Home / Self Care | Payer: No Typology Code available for payment source | Attending: Emergency Medicine | Admitting: Emergency Medicine

## 2018-08-29 DIAGNOSIS — S6981XA Other specified injuries of right wrist, hand and finger(s), initial encounter: Secondary | ICD-10-CM | POA: Insufficient documentation

## 2018-08-29 DIAGNOSIS — Y929 Unspecified place or not applicable: Secondary | ICD-10-CM | POA: Diagnosis not present

## 2018-08-29 DIAGNOSIS — Y939 Activity, unspecified: Secondary | ICD-10-CM | POA: Insufficient documentation

## 2018-08-29 DIAGNOSIS — Z79899 Other long term (current) drug therapy: Secondary | ICD-10-CM | POA: Insufficient documentation

## 2018-08-29 DIAGNOSIS — S6991XA Unspecified injury of right wrist, hand and finger(s), initial encounter: Secondary | ICD-10-CM

## 2018-08-29 DIAGNOSIS — Y999 Unspecified external cause status: Secondary | ICD-10-CM | POA: Diagnosis not present
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
## 2018-08-29 NOTE — ED Notes (Signed)
Bed: WTR5 Expected date:  Expected time:  Means of arrival:  Comments: 

## 2018-08-29 NOTE — ED Notes (Signed)
Patient refused discharge Vitals.

## 2018-08-29 NOTE — Discharge Instructions (Signed)
It was my pleasure taking care of you today!   Keep your finger in the splint provided.   Call the hand doctor listed to schedule a follow-up appointment.  Return to the emergency department for new or worsening symptoms, any additional concerns.
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
## 2018-10-15 ENCOUNTER — Inpatient Hospital Stay: Payer: Self-pay | Admitting: Critical Care Medicine

## 2018-10-16 ENCOUNTER — Other Ambulatory Visit: Payer: Self-pay

## 2018-10-16 ENCOUNTER — Encounter: Payer: Self-pay | Admitting: Critical Care Medicine

## 2018-10-16 ENCOUNTER — Ambulatory Visit: Payer: Self-pay | Attending: Critical Care Medicine | Admitting: Critical Care Medicine

## 2018-10-16 VITALS — BP 117/72 | HR 52 | Ht 64.0 in | Wt 148.6 lb

## 2018-10-16 DIAGNOSIS — S6991XS Unspecified injury of right wrist, hand and finger(s), sequela: Secondary | ICD-10-CM

## 2018-10-16 DIAGNOSIS — Z1239 Encounter for other screening for malignant neoplasm of breast: Secondary | ICD-10-CM | POA: Insufficient documentation

## 2018-10-16 NOTE — Patient Instructions (Signed)
A referral to orthopedic surgery will be made to evaluate your right hand  A screening mammogram will be scheduled  We will have you fill out financial paperwork for financial assistance  We will schedule you with primary care here to establish for a Pap smear  An x-ray of the right hand will be scheduled

## 2018-10-16 NOTE — Assessment & Plan Note (Signed)
The patient has what may be a tendon injury to the right index finger The patient's lacerations have healed on the right arm however there may be a retained foreign body of glass in the right fifth finger on the dorsum  Plan will be to refer to orthopedics and also obtain hand x-ray

## 2018-10-16 NOTE — Assessment & Plan Note (Signed)
The patient wants a breast mammogram and I agree with this given her family history therefore screening mammogram was ordered
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
## 2018-10-18 ENCOUNTER — Ambulatory Visit (INDEPENDENT_AMBULATORY_CARE_PROVIDER_SITE_OTHER): Payer: Self-pay | Admitting: Orthopaedic Surgery

## 2018-10-18 ENCOUNTER — Ambulatory Visit (INDEPENDENT_AMBULATORY_CARE_PROVIDER_SITE_OTHER): Payer: Self-pay

## 2018-10-18 DIAGNOSIS — M79644 Pain in right finger(s): Secondary | ICD-10-CM
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
## 2018-10-23 ENCOUNTER — Ambulatory Visit: Payer: Self-pay | Attending: Orthopaedic Surgery | Admitting: Occupational Therapy

## 2018-10-23 ENCOUNTER — Other Ambulatory Visit: Payer: Self-pay

## 2018-10-23 DIAGNOSIS — M25641 Stiffness of right hand, not elsewhere classified: Secondary | ICD-10-CM | POA: Insufficient documentation

## 2018-10-23 DIAGNOSIS — M6281 Muscle weakness (generalized): Secondary | ICD-10-CM | POA: Insufficient documentation

## 2018-10-23 DIAGNOSIS — M79641 Pain in right hand: Secondary | ICD-10-CM | POA: Insufficient documentation

## 2018-10-23 NOTE — Patient Instructions (Signed)
Your Splint This splint should initially be fitted by a healthcare practitioner.  The healthcare practitioner is responsible for providing wearing instructions and precautions to the patient, other healthcare practitioners and care provider involved in the patient's care.  This splint was custom made for you. Please read the following instructions to learn about wearing and caring for your splint.  Precautions Should your splint cause any of the following problems, remove the splint immediately and contact your therapist/physician.  Swelling  Severe Pain  Pressure Areas  Stiffness  Numbness  When To Wear Your Splint Where your splint according to your therapist/physician instructions. All the time, remove only for cleaning finger, keep middle joint straight at all times If hand gets wet dry thoroughly  Care and Cleaning of Your Splint 1. Keep your splint away from open flames. 2. Your splint will lose its shape in temperatures over 135 degrees Farenheit, ( in car windows, near radiators, ovens or in hot water).  Never make any adjustments to your splint, if the splint needs adjusting remove it and make an appointment to see your therapist. 3. Your splint, including the cushion liner may be cleaned with rubbing alcohol Do not immerse in hot water over 135 degrees Farenheit. 4. Do not leave splint in your car   Perform exercises while wearing splint!  MP Flexion (Active Isolated)   Bend _index_____ finger at large knuckle, keeping other fingers straight. Do not bend tips. Repeat _10-15___ times. Do __4-6__ sessions per day.   AROM: DIP Flexion / Extension   Pinch middle knuckle of __index______ finger of  hand to prevent bending. Bend end knuckle until stretch is felt. Hold _5___ seconds. Relax. Straighten finger as far as possible. Repeat _10-15___ times per set.  Do _4-6___ sessions per day.
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
## 2018-11-06 ENCOUNTER — Other Ambulatory Visit: Payer: Self-pay

## 2018-11-06 ENCOUNTER — Ambulatory Visit: Payer: Self-pay | Admitting: Occupational Therapy

## 2018-11-06 DIAGNOSIS — M79641 Pain in right hand: Secondary | ICD-10-CM

## 2018-11-06 DIAGNOSIS — M25641 Stiffness of right hand, not elsewhere classified: Secondary | ICD-10-CM

## 2018-11-06 DIAGNOSIS — M6281 Muscle weakness (generalized): Secondary | ICD-10-CM
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
## 2018-11-15 ENCOUNTER — Ambulatory Visit: Payer: No Typology Code available for payment source | Attending: Internal Medicine | Admitting: Internal Medicine

## 2018-11-15 ENCOUNTER — Other Ambulatory Visit: Payer: Self-pay

## 2018-11-15 ENCOUNTER — Encounter: Payer: Self-pay | Admitting: Internal Medicine

## 2018-11-15 VITALS — BP 126/68 | HR 58 | Temp 98.2°F | Resp 16 | Ht 64.0 in | Wt 147.6 lb

## 2018-11-15 DIAGNOSIS — Z114 Encounter for screening for human immunodeficiency virus [HIV]: Secondary | ICD-10-CM

## 2018-11-15 DIAGNOSIS — Z124 Encounter for screening for malignant neoplasm of cervix: Secondary | ICD-10-CM

## 2018-11-15 DIAGNOSIS — E663 Overweight: Secondary | ICD-10-CM

## 2018-11-15 DIAGNOSIS — Z2821 Immunization not carried out because of patient refusal: Secondary | ICD-10-CM

## 2018-11-15 DIAGNOSIS — F3281 Premenstrual dysphoric disorder: Secondary | ICD-10-CM | POA: Insufficient documentation

## 2018-11-15 DIAGNOSIS — F418 Other specified anxiety disorders: Secondary | ICD-10-CM
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
## 2018-11-16 LAB — HEMOGLOBIN A1C
Est. average glucose Bld gHb Est-mCnc: 91 mg/dL
Hgb A1c MFr Bld: 4.8 % (ref 4.8–5.6)

## 2018-11-16 LAB — HIV ANTIBODY (ROUTINE TESTING W REFLEX): HIV Screen 4th Generation wRfx: NONREACTIVE

## 2018-11-17 LAB — CERVICOVAGINAL ANCILLARY ONLY
Bacterial vaginitis: POSITIVE — AB
Candida vaginitis: NEGATIVE
Chlamydia: NEGATIVE
Neisseria Gonorrhea: NEGATIVE
Trichomonas: NEGATIVE

## 2018-11-18 ENCOUNTER — Other Ambulatory Visit: Payer: Self-pay | Admitting: Internal Medicine

## 2018-11-18 MED ORDER — METRONIDAZOLE 500 MG PO TABS: 500.0000 mg | ORAL_TABLET | Freq: Two times a day (BID) | ORAL | 0 refills | Status: DC

## 2018-11-20 MED FILL — metroNIDAZOLE 500 MG TABS: 500 | 7 days supply | Qty: 14 | Fill #0

## 2018-11-21 LAB — CYTOLOGY - PAP
Adequacy: ABSENT
Diagnosis: NEGATIVE
HPV: NOT DETECTED

## 2018-12-13 ENCOUNTER — Encounter: Payer: Self-pay | Admitting: Orthopaedic Surgery

## 2018-12-13 ENCOUNTER — Ambulatory Visit (INDEPENDENT_AMBULATORY_CARE_PROVIDER_SITE_OTHER): Payer: Self-pay | Admitting: Orthopaedic Surgery

## 2018-12-13 DIAGNOSIS — M79644 Pain in right finger(s): Secondary | ICD-10-CM
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
## 2018-12-14 ENCOUNTER — Other Ambulatory Visit: Payer: Self-pay

## 2018-12-14 ENCOUNTER — Encounter: Payer: Self-pay | Admitting: Occupational Therapy

## 2018-12-14 ENCOUNTER — Ambulatory Visit: Payer: Self-pay | Attending: Orthopaedic Surgery | Admitting: Occupational Therapy

## 2018-12-14 DIAGNOSIS — M6281 Muscle weakness (generalized): Secondary | ICD-10-CM | POA: Insufficient documentation

## 2018-12-14 DIAGNOSIS — M79641 Pain in right hand: Secondary | ICD-10-CM | POA: Insufficient documentation

## 2018-12-14 DIAGNOSIS — M25641 Stiffness of right hand, not elsewhere classified: Secondary | ICD-10-CM | POA: Insufficient documentation
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
## 2018-12-14 NOTE — Patient Instructions (Signed)
  Flexor Tendon Gliding (Active Hook Fist)   With fingers and knuckles straight, bend middle and tip joints. Do not bend large knuckles. Repeat _10-15___ times. Do _4-6___ sessions per day.  MP Flexion (Active)   With back of hand on table, bend large knuckles as far as they will go, keeping small joints straight. Repeat _10-15___ times. Do __4-6__ sessions per day. Activity: Reach into a narrow container.*      Finger Flexion / Extension   With palm up, bend fingers of left hand toward palm, making a  fist. Straighten fingers, opening fist. Repeat sequence _10-15___ times per session. Do _4-6__ sessions per day. Hand Variation: Palm down   Copyright  VHI. All rights reserved.  MP Flexion (Active Isolated)   Bend __all___ fingers at large knuckle, keeping other fingers straight. Do not bend tips. Repeat _10-15___ times. Do __4-6__ sessions per day.  AROM: PIP Flexion / Extension   Pinch bottom knuckle of _index_______ finger of hand to prevent bending. Actively bend middle knuckle until stretch is felt. Hold __5__ seconds. Relax. Straighten finger as far as possible. Repeat __10-15__ times per set. Do _4-6___ sessions per day.   AROM: DIP Flexion / Extension   Pinch middle knuckle of ___index_____ finger of  hand to prevent bending. Bend end knuckle until stretch is felt. Hold _5___ seconds. Relax. Straighten finger as far as possible. Repeat _10-15___ times per set.  Do _4-6___ sessions per day.  AROM: Finger Flexion / Extension   Actively bend fingers of  hand. Start with knuckles furthest from palm, and slowly make a fist. Hold __5__ seconds. Relax. Then straighten fingers as far as possible. Repeat _10-15___ times per set.  Do _4-6___ sessions per day.  Copyright  VHI. All rights reserved.      PROM: Finger MP Joints   Passively bend ___index_____ finger of hand at big knuckle until stretch is felt. Hold _10___ seconds. Relax. Straighten finger as far as  possible. Repeat __5__ times per set.  Do __4-6__ sessions per day.   PIP Flexion (Passive)   Use other hand to bend the middle joint of _index _____ finger down as far as possible. Hold _10___ seconds. Repeat __5__ times. Do _4-6___ sessions per day.    PROM: Finger DIP Joints   Passively bend ___index_____ finger(s) of  hand at tip joint until stretch is felt. Hold __10__ seconds. Relax. Straighten finger as far as possible. Repeat _5___ times per set.  Do __4-6__ sessions per day.  Copyright  VHI. All rights reserved.

## 2019-01-23 ENCOUNTER — Ambulatory Visit: Payer: Self-pay | Admitting: Orthopaedic Surgery
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
## 2019-09-29 DIAGNOSIS — Z3009 Encounter for other general counseling and advice on contraception: Secondary | ICD-10-CM | POA: Diagnosis not present

## 2019-09-29 DIAGNOSIS — Z0389 Encounter for observation for other suspected diseases and conditions ruled out: Secondary | ICD-10-CM | POA: Diagnosis not present

## 2019-09-29 DIAGNOSIS — Z1388 Encounter for screening for disorder due to exposure to contaminants: Secondary | ICD-10-CM | POA: Diagnosis not present

## 2019-11-27 DIAGNOSIS — Z1388 Encounter for screening for disorder due to exposure to contaminants: Secondary | ICD-10-CM | POA: Diagnosis not present

## 2019-11-27 DIAGNOSIS — Z0389 Encounter for observation for other suspected diseases and conditions ruled out: Secondary | ICD-10-CM | POA: Diagnosis not present

## 2019-11-27 DIAGNOSIS — Z3009 Encounter for other general counseling and advice on contraception: Secondary | ICD-10-CM | POA: Diagnosis not present

## 2020-03-24 DIAGNOSIS — Z1152 Encounter for screening for COVID-19: Secondary | ICD-10-CM | POA: Diagnosis not present

## 2020-06-02 ENCOUNTER — Emergency Department (HOSPITAL_COMMUNITY)
Admission: EM | Admit: 2020-06-02 | Discharge: 2020-06-02 | Disposition: A | Discharge status: Home / Self Care | Payer: Medicaid Other | Attending: Emergency Medicine | Admitting: Emergency Medicine

## 2020-06-02 ENCOUNTER — Encounter (HOSPITAL_COMMUNITY): Payer: Self-pay

## 2020-06-02 ENCOUNTER — Other Ambulatory Visit: Payer: Self-pay

## 2020-06-02 DIAGNOSIS — Z87891 Personal history of nicotine dependence: Secondary | ICD-10-CM | POA: Insufficient documentation

## 2020-06-02 DIAGNOSIS — L0291 Cutaneous abscess, unspecified: Secondary | ICD-10-CM

## 2020-06-02 DIAGNOSIS — L02412 Cutaneous abscess of left axilla: Secondary | ICD-10-CM | POA: Insufficient documentation

## 2020-06-02 MED ORDER — LIDOCAINE-EPINEPHRINE 2 %-1:100000 IJ SOLN
10.0000 mL | Freq: Once | INTRAMUSCULAR | Status: AC
  Administered 2020-06-02: 10 mL via INTRADERMAL
  Filled 2020-06-02: qty 1

## 2020-06-02 MED ORDER — IBUPROFEN 800 MG PO TABS
800.0000 mg | ORAL_TABLET | Freq: Once | ORAL | Status: AC
  Administered 2020-06-02: 800 mg via ORAL
  Filled 2020-06-02: qty 1

## 2020-06-02 MED ORDER — DOXYCYCLINE HYCLATE 100 MG PO TABS
100.0000 mg | ORAL_TABLET | Freq: Once | ORAL | Status: AC
  Administered 2020-06-02: 100 mg via ORAL
  Filled 2020-06-02: qty 1

## 2020-06-02 MED ORDER — DOXYCYCLINE HYCLATE 100 MG PO CAPS: 100.0000 mg | ORAL_CAPSULE | Freq: Two times a day (BID) | ORAL | 0 refills | Status: AC

## 2020-06-02 NOTE — ED Triage Notes (Signed)
Pt presents with c/o boil under her left arm. Pt reports that she has had an area like this before that had to be drained.

## 2020-06-02 NOTE — ED Notes (Signed)
An After Visit Summary was printed and given to the patient. Discharge instructions given and no further questions at this time.  

## 2020-06-02 NOTE — Discharge Instructions (Addendum)
You were seen in the emergency department for the abscess in your armpit.  Your physical exam and vital signs are reassuring.  Your abscess was drained while in the emergency department and administered your first dose of antibiotics.  You continue to apply warm compresses to the area, and you have been prescribed an antibiotic called doxycycline to take twice daily for the next week.  Please be aware that this medication can make you more sensitive to the sun.  You may follow-up with your primary care doctor.  Return to the emergency department if you develop any fevers, chills, nausea vomiting that does not stop, or any other severe symptoms.
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
## 2020-06-03 ENCOUNTER — Telehealth: Payer: Self-pay | Admitting: *Deleted

## 2020-06-03 NOTE — Telephone Encounter (Signed)
Transition Care Management Follow-up Telephone Call  Date of discharge and from where: 06/02/2020 - [facility]  How have you been since you were released from the hospital? "Still in some pain but better"  Any questions or concerns? No  Items Reviewed:  Did the pt receive and understand the discharge instructions provided? Yes   Medications obtained and verified? Yes   Other? No   Any new allergies since your discharge? No   Dietary orders reviewed? No  Do you have support at home? Yes   Functional Questionnaire: (I = Independent and D = Dependent) ADLs: I  Bathing/Dressing- I  Meal Prep- I  Eating- I  Maintaining continence- I  Transferring/Ambulation- I  Managing Meds- I  Follow up appointments reviewed:   PCP Hospital f/u appt confirmed? No    Specialist Hospital f/u appt confirmed? No    Are transportation arrangements needed? No   If their condition worsens, is the pt aware to call PCP or go to the Emergency Dept.? Yes  Was the patient provided with contact information for the PCP's office or ED? Yes  Was to pt encouraged to call back with questions or concerns? Yes

## 2020-06-26 ENCOUNTER — Inpatient Hospital Stay (HOSPITAL_COMMUNITY)
Admission: AD | Admit: 2020-06-26 | Discharge: 2020-06-26 | Disposition: A | Discharge status: Home / Self Care | Payer: Medicaid Other | Attending: Obstetrics & Gynecology | Admitting: Obstetrics & Gynecology

## 2020-06-26 ENCOUNTER — Encounter (HOSPITAL_COMMUNITY): Payer: Self-pay | Admitting: Obstetrics & Gynecology

## 2020-06-26 ENCOUNTER — Inpatient Hospital Stay (HOSPITAL_COMMUNITY): Payer: Medicaid Other

## 2020-06-26 ENCOUNTER — Other Ambulatory Visit: Payer: Self-pay

## 2020-06-26 DIAGNOSIS — O3680X0 Pregnancy with inconclusive fetal viability, not applicable or unspecified: Secondary | ICD-10-CM | POA: Diagnosis not present

## 2020-06-26 DIAGNOSIS — Z3A01 Less than 8 weeks gestation of pregnancy: Secondary | ICD-10-CM | POA: Insufficient documentation

## 2020-06-26 DIAGNOSIS — R102 Pelvic and perineal pain: Secondary | ICD-10-CM | POA: Diagnosis not present

## 2020-06-26 DIAGNOSIS — O26891 Other specified pregnancy related conditions, first trimester: Secondary | ICD-10-CM | POA: Diagnosis not present

## 2020-06-26 DIAGNOSIS — Z87891 Personal history of nicotine dependence: Secondary | ICD-10-CM | POA: Diagnosis not present

## 2020-06-26 DIAGNOSIS — O26899 Other specified pregnancy related conditions, unspecified trimester: Secondary | ICD-10-CM

## 2020-06-26 LAB — COMPREHENSIVE METABOLIC PANEL
ALT: 18 U/L (ref 0–44)
AST: 19 U/L (ref 15–41)
Albumin: 3.9 g/dL (ref 3.5–5.0)
Alkaline Phosphatase: 75 U/L (ref 38–126)
Anion gap: 5 (ref 5–15)
BUN: 5 mg/dL — ABNORMAL LOW (ref 6–20)
CO2: 24 mmol/L (ref 22–32)
Calcium: 9.3 mg/dL (ref 8.9–10.3)
Chloride: 107 mmol/L (ref 98–111)
Creatinine, Ser: 0.8 mg/dL (ref 0.44–1.00)
GFR, Estimated: >60 mL/min (ref >60)
Glucose, Bld: 81 mg/dL (ref 70–99)
Potassium: 3.8 mmol/L (ref 3.5–5.1)
Sodium: 136 mmol/L (ref 135–145)
Total Bilirubin: 0.7 mg/dL (ref 0.3–1.2)
Total Protein: 7.4 g/dL (ref 6.5–8.1)

## 2020-06-26 LAB — CBC
HCT: 36.1 % (ref 36.0–46.0)
Hemoglobin: 12 g/dL (ref 12.0–15.0)
MCH: 31.7 pg (ref 26.0–34.0)
MCHC: 33.2 g/dL (ref 30.0–36.0)
MCV: 95.3 fL (ref 80.0–100.0)
Platelets: 294 10*3/uL (ref 150–400)
RBC: 3.79 MIL/uL — ABNORMAL LOW (ref 3.87–5.11)
RDW: 12.4 % (ref 11.5–15.5)
WBC: 8 10*3/uL (ref 4.0–10.5)
nRBC: 0 % (ref 0.0–0.2)

## 2020-06-26 LAB — URINALYSIS, ROUTINE W REFLEX MICROSCOPIC
Bilirubin Urine: NEGATIVE
Glucose, UA: NEGATIVE mg/dL
Hgb urine dipstick: NEGATIVE
Ketones, ur: NEGATIVE mg/dL
Leukocytes,Ua: NEGATIVE
Nitrite: NEGATIVE
Protein, ur: NEGATIVE mg/dL
Specific Gravity, Urine: 1.013 (ref 1.005–1.030)
pH: 6 (ref 5.0–8.0)

## 2020-06-26 LAB — WET PREP, GENITAL
Sperm: NONE SEEN
Trich, Wet Prep: NONE SEEN
WBC, Wet Prep HPF POC: NONE SEEN
Yeast Wet Prep HPF POC: NONE SEEN

## 2020-06-26 LAB — POCT PREGNANCY, URINE: Preg Test, Ur: POSITIVE — AB

## 2020-06-26 LAB — HCG, QUANTITATIVE, PREGNANCY: hCG, Beta Chain, Quant, S: 2683 m[IU]/mL — ABNORMAL HIGH (ref <5)

## 2020-06-26 MED ORDER — PREPLUS 27-1 MG PO TABS: 1.0000 | ORAL_TABLET | Freq: Every day | ORAL | 13 refills | Status: DC

## 2020-06-26 NOTE — MAU Provider Note (Signed)
History     CSN: [id number]  Arrival date and time: 06/26/20 1603  Event Date/Time  First Provider Initiated Contact with Patient 06/26/20 1858     Chief Complaint  Patient presents with  . Abdominal Pain  . Possible Pregnancy   HPI Jennifer Campbell is a 30 y.o. G2P0 in early pregnancy who presents to MAU with chief complaint of abdominal cramping in the setting of positive home pregnancy test. This is a new problem, onset about five days ago. Patient's pain is mild, "like my period coming on", suprapubic and does not radiate. She denies aggravating or alleviating factors. She has not taken medication or tried other treatments for this complaint. She denies spotting, dysuria, fever or recent illness.  OB History    Gravida  2   Para      Term      Preterm      AB      Living        SAB      IAB      Ectopic      Multiple      Live Births              Past Medical History:  Diagnosis Date  . Medical history non-contributory   . Scoliosis   . Substance induced mood disorder (HCC) 08/13/2014    Past Surgical History:  Procedure Laterality Date  . EYE SURGERY     as a child to help alignmen  . NO PAST SURGERIES      Family History  Problem Relation Age of Onset  . Depression Mother   . Asthma Mother   . Rectal cancer Mother   . Diabetes Maternal Aunt   . Breast cancer Maternal Grandfather   . Diabetes Maternal Grandfather   . Depression Paternal Grandmother   . Diabetes Paternal Grandmother   . Cancer Paternal Grandmother   . Lung cancer Maternal Uncle   . Diabetes Paternal Aunt     Social History   Tobacco Use  . Smoking status: Former Smoker    Packs/day: 0.15    Types: Cigarettes    Quit date: 11/12/2017    Years since quitting: 2.6  . Smokeless tobacco: Never Used  Vaping Use  . Vaping Use: Never used  Substance Use Topics  . Alcohol use: Yes    Comment: occasionally  . Drug use: No    Comment: THC    Allergies: No Known  Allergies  Medications Prior to Admission  Medication Sig Dispense Refill Last Dose  . acetaminophen (TYLENOL) 325 MG tablet Take 2 tablets (650 mg total) by mouth every 6 (six) hours as needed. 30 tablet 0   . albuterol (PROVENTIL HFA;VENTOLIN HFA) 108 (90 BASE) MCG/ACT inhaler Inhale 2 puffs into the lungs every 4 (four) hours as needed for wheezing or shortness of breath. 1 Inhaler 0   . metroNIDAZOLE (FLAGYL) 500 MG tablet Take 1 tablet (500 mg total) by mouth 2 (two) times daily. 14 tablet 0     Review of Systems  Gastrointestinal: Positive for abdominal pain.  Genitourinary: Negative for vaginal bleeding.  Musculoskeletal: Negative for back pain.  All other systems reviewed and are negative.  Physical Exam   Blood pressure 136/78, pulse 84, temperature 98.4 F (36.9 C), temperature source Oral, resp. rate 18, height 5\' 4"  (1.626 m), weight 82 kg, last menstrual period 05/27/2020, SpO2 100 %.  Physical Exam Vitals and nursing note reviewed. Exam conducted with a chaperone present.  Constitutional:      General: She is not in acute distress.    Appearance: She is well-developed. She is not ill-appearing.  Cardiovascular:     Rate and Rhythm: Normal rate.     Heart sounds: Normal heart sounds.  Pulmonary:     Effort: Pulmonary effort is normal.     Breath sounds: Normal breath sounds.  Abdominal:     Tenderness: There is no abdominal tenderness.  Genitourinary:    Comments: Pelvic deferred, swabs collected via blind swab Skin:    Capillary Refill: Capillary refill takes less than 2 seconds.  Neurological:     Mental Status: She is alert and oriented to person, place, and time.  Psychiatric:        Mood and Affect: Mood normal.        Behavior: Behavior normal.     MAU Course  Procedures  Orders Placed This Encounter  Procedures  . Wet prep, genital  . US OB LESS THAN 14 WEEKS WITH OB TRANSVAGINAL  . Urinalysis, Routine w reflex microscopic Urine, Clean Catch  .  CBC  . Comprehensive metabolic panel  . hCG, quantitative, pregnancy  . Nursing communication  . Pregnancy, urine POC  . Discharge patient   Patient Vitals for the past 24 hrs:  BP Temp Temp src Pulse Resp SpO2 Height Weight  06/26/20 1652 136/78 98.4 F (36.9 C) Oral 84 18 100 % 5\' 4"  (1.626 m) 82 kg   Results for orders placed or performed during the hospital encounter of 06/26/20 (from the past 24 hour(s))  Pregnancy, urine POC     Status: Abnormal   Collection Time: 06/26/20  4:27 PM  Result Value Ref Range   Preg Test, Ur POSITIVE (A) NEGATIVE  Urinalysis, Routine w reflex microscopic Urine, Clean Catch     Status: Abnormal   Collection Time: 06/26/20  4:35 PM  Result Value Ref Range   Color, Urine YELLOW YELLOW   APPearance HAZY (A) CLEAR   Specific Gravity, Urine 1.013 1.005 - 1.030   pH 6.0 5.0 - 8.0   Glucose, UA NEGATIVE NEGATIVE mg/dL   Hgb urine dipstick NEGATIVE NEGATIVE   Bilirubin Urine NEGATIVE NEGATIVE   Ketones, ur NEGATIVE NEGATIVE mg/dL   Protein, ur NEGATIVE NEGATIVE mg/dL   Nitrite NEGATIVE NEGATIVE   Leukocytes,Ua NEGATIVE NEGATIVE  CBC     Status: Abnormal   Collection Time: 06/26/20  5:09 PM  Result Value Ref Range   WBC 8.0 4.0 - 10.5 K/uL   RBC 3.79 (L) 3.87 - 5.11 MIL/uL   Hemoglobin 12.0 12.0 - 15.0 g/dL   HCT 06/28/20 32.2 - 02.5 %   MCV 95.3 80.0 - 100.0 fL   MCH 31.7 26.0 - 34.0 pg   MCHC 33.2 30.0 - 36.0 g/dL   RDW 42.7 06.2 - 37.6 %   Platelets 294 150 - 400 K/uL   nRBC 0.0 0.0 - 0.2 %  Comprehensive metabolic panel     Status: Abnormal   Collection Time: 06/26/20  5:09 PM  Result Value Ref Range   Sodium 136 135 - 145 mmol/L   Potassium 3.8 3.5 - 5.1 mmol/L   Chloride 107 98 - 111 mmol/L   CO2 24 22 - 32 mmol/L   Glucose, Bld 81 70 - 99 mg/dL   BUN 5 (L) 6 - 20 mg/dL   Creatinine, Ser 06/28/20 0.44 - 1.00 mg/dL   Calcium 9.3 8.9 - 1.51 mg/dL   Total Protein 7.4 6.5 - 8.1  g/dL   Albumin 3.9 3.5 - 5.0 g/dL   AST 19 15 - 41 U/L   ALT  18 0 - 44 U/L   Alkaline Phosphatase 75 38 - 126 U/L   Total Bilirubin 0.7 0.3 - 1.2 mg/dL   GFR, Estimated >35 >36 mL/min   Anion gap 5 5 - 15  hCG, quantitative, pregnancy     Status: Abnormal   Collection Time: 06/26/20  5:09 PM  Result Value Ref Range   hCG, Beta Chain, Quant, S 2,683 (H) <5 mIU/mL  Wet prep, genital     Status: Abnormal   Collection Time: 06/26/20  5:40 PM   Specimen: PATH Cytology Cervicovaginal Ancillary Only  Result Value Ref Range   Yeast Wet Prep HPF POC NONE SEEN NONE SEEN   Trich, Wet Prep NONE SEEN NONE SEEN   Clue Cells Wet Prep HPF POC PRESENT (A) NONE SEEN   WBC, Wet Prep HPF POC NONE SEEN NONE SEEN   Sperm NONE SEEN    US OB LESS THAN 14 WEEKS WITH OB TRANSVAGINAL  Result Date: 06/26/2020 CLINICAL DATA:  Cramping. EXAM: OBSTETRIC <14 WK [place] AND TRANSVAGINAL OB US TECHNIQUE: Both transabdominal and transvaginal ultrasound examinations were performed for complete evaluation of the gestation as well as the maternal uterus, adnexal regions, and pelvic cul-de-sac. Transvaginal technique was performed to assess early pregnancy. COMPARISON:  Ob ultrasound 10/01/2015 FINDINGS: Intrauterine gestational sac: Single Yolk sac:  Not Visualized. Embryo:  Not Visualized. MSD: 3.1 mm   5 w   0  d Subchorionic hemorrhage:  None visualized. Maternal uterus/adnexae: Normal appearance of the bilateral ovaries. Trace free fluid in the pelvis. IMPRESSION: Probable early intrauterine gestational sac, but no yolk sac, fetal pole, or cardiac activity yet visualized. Recommend follow-up quantitative B-HCG levels and follow-up US in 14 days to assess viability. This recommendation follows SRU consensus guidelines: Diagnostic Criteria for Nonviable Pregnancy Early in the First Trimester. Malva Limes Med 2013; 144:3154-00. Electronically Signed   By: Emmaline Kluver M.D.   On: 06/26/2020 18:38   Assessment and Plan  --30 y.o. G2P0 with PUL --IUGS --Quant hCG 2683 --Blood type A  POS --Clue cells without other Amsel's Criteria, will defer treatment for BV --Discharge home in stable condition with ectopic precautions  F/U: --Appt made for repeat stat Quant hCG Monday 06/29/2020 at 0900 at [facility], MSA, MSN, CNM 06/26/2020, 7:35 PM

## 2020-06-26 NOTE — Discharge Instructions (Signed)
Human Chorionic Gonadotropin Test Why am I having this test? A human chorionic gonadotropin (hCG) test is done to determine whether you are pregnant. It can also be used:  To diagnose an abnormal pregnancy.  To determine whether you have had a miscarriage or are at risk of one. What is being tested? This test checks the level of the human chorionic gonadotropin (hCG) hormone in the blood. This hormone is produced during pregnancy by the cells that form the placenta. The placenta is the organ that grows inside your uterus to nourish a developing baby. When you are pregnant, hCG can be detected in your blood or urine 7 to 8 days before your missed period. The amount of hCG continues to increase for the first 8-10 weeks of pregnancy. The presence of hCG in your blood can be measured with different types of tests. You may have:  A urine test. ? A urine test only shows whether there is hCG in your urine. It does not measure how much.  A qualitative blood test. ? This blood test only shows whether there is hCG in your blood. It does not measure how much.  A quantitative blood test. ? This type of blood test measures the amount of hCG in your blood. ? You may have this test to:  Diagnose an abnormal pregnancy.  Check whether you have had a miscarriage.  Determine whether you are at risk of a miscarriage.  Determine if treatment of an ectopic pregnancy is successful. What kind of sample is taken? Two kinds of samples may be collected to test for the hCG hormone.  Blood. It is usually collected by inserting a needle into a blood vessel.  Urine. It is usually collected by urinating into a germ-free (sterile) specimen cup.      How do I prepare for this test? No preparation is needed for a blood test.  Some preparation is needed for a urine test:  For best results, collect the sample the first time you urinate in the morning. That is when the concentration of hCG is highest.  Do not  drink too much fluid. Drink as you normally would, or as directed by your health care provider. Tell a health care provider about:  All medicines you are taking, including vitamins, herbs, eye drops, creams, and over-the-counter medicines.  Any blood in your urine. This may interfere with the result. How are the results reported? Depending on the type of test that you have, your test results may be reported as values. Your health care provider will compare your results to normal ranges that were established after testing a large group of people (reference ranges). Reference ranges may vary among labs and hospitals. For this test, common reference ranges that show absence of pregnancy are:  Quantitative hCG blood levels: less than 5 IU/L. Other results will be reported as either positive or negative. For this test, normal results (meaning the absence of pregnancy) are:  Negative for hCG in the urine test.  Negative for hCG in the qualitative blood test. What do the results mean? Urine and qualitative blood test  A negative result could mean: ? That you are not pregnant. ? That the test was done too early in your pregnancy to detect hCG in your blood or urine. If you still have other signs of pregnancy, the test will be repeated.  A positive result means: ? That you are most likely pregnant. Your health care provider may confirm your pregnancy with an ultrasound of   your uterus, if needed. Quantitative blood test Results of the quantitative hCG blood test will be reported as values. These values will be interpreted by your health care provider along with your medical history and symptoms you are experiencing. Results outside of expected ranges could mean that:  You are pregnant with twins.  You have abnormal growths in your uterus.  You have an ectopic pregnancy.  You may be experiencing a miscarriage. Talk with your health care provider about what your results mean. Questions to ask  your health care provider Ask your health care provider, or the department that is doing the test:  When will my results be ready?  How will I get my results?  What are my treatment options?  What other tests do I need?  What are my next steps? Summary  A human chorionic gonadotropin (hCG) test is done to determine whether you are pregnant.  When you are pregnant, hCG can be detected in your blood or urine 7 to 8 days before your missed period. HCG levels continue to go up for the first 8-10 weeks of pregnancy.  Your hCG level can be measured with different types of tests. You may have a urine test, a qualitative blood test, or a quantitative blood test.  Talk with your health care provider about what your test results mean. This information is not intended to replace advice given to you by your health care provider. Make sure you discuss any questions you have with your health care provider. Document Revised: 12/02/2019 Document Reviewed: 12/02/2019 Elsevier Patient Education  2021 Elsevier Inc.  Safe Medications in Pregnancy   Acne: Benzoyl Peroxide Salicylic Acid  Backache/Headache: Tylenol: 2 regular strength every 4 hours OR              2 Extra strength every 6 hours  Colds/Coughs/Allergies: Benadryl (alcohol free) 25 mg every 6 hours as needed Breath right strips Claritin Cepacol throat lozenges Chloraseptic throat spray Cold-Eeze- up to three times per day Cough drops, alcohol free Flonase (by prescription only) Guaifenesin Mucinex Robitussin DM (plain only, alcohol free) Saline nasal spray/drops Sudafed (pseudoephedrine) & Actifed ** use only after [redacted] weeks gestation and if you do not have high blood pressure Tylenol Vicks Vaporub Zinc lozenges Zyrtec   Constipation: Colace Ducolax suppositories Fleet enema Glycerin suppositories Metamucil Milk of magnesia Miralax Senokot Smooth move tea  Diarrhea: Kaopectate Imodium A-D  *NO pepto  Bismol  Hemorrhoids: Anusol Anusol HC Preparation H Tucks  Indigestion: Tums Maalox Mylanta Zantac  Pepcid  Insomnia: Benadryl (alcohol free) 25mg  every 6 hours as needed Tylenol PM Unisom, no Gelcaps  Leg Cramps: Tums MagGel  Nausea/Vomiting:  Bonine Dramamine Emetrol Ginger extract Sea bands Meclizine  Nausea medication to take during pregnancy:  Unisom (doxylamine succinate 25 mg tablets) Take one tablet daily at bedtime. If symptoms are not adequately controlled, the dose can be increased to a maximum recommended dose of two tablets daily (1/2 tablet in the morning, 1/2 tablet mid-afternoon and one at bedtime). Vitamin B6 100mg  tablets. Take one tablet twice a day (up to 200 mg per day).  Skin Rashes: Aveeno products Benadryl cream or 25mg  every 6 hours as needed Calamine Lotion 1% cortisone cream  Yeast infection: Gyne-lotrimin 7 Monistat 7   **If taking multiple medications, please check labels to avoid duplicating the same active ingredients **take medication as directed on the label ** Do not exceed 4000 mg of tylenol in 24 hours **Do not take medications that contain aspirin or  ibuprofen

## 2020-06-26 NOTE — MAU Note (Signed)
+  HPT at home, has been cramping. No bleeding.

## 2020-06-29 ENCOUNTER — Encounter: Payer: Self-pay | Admitting: *Deleted

## 2020-06-29 ENCOUNTER — Ambulatory Visit (INDEPENDENT_AMBULATORY_CARE_PROVIDER_SITE_OTHER): Payer: Self-pay | Admitting: *Deleted

## 2020-06-29 ENCOUNTER — Other Ambulatory Visit: Payer: Self-pay

## 2020-06-29 VITALS — BP 115/67 | HR 74 | Ht 64.0 in | Wt 180.7 lb

## 2020-06-29 DIAGNOSIS — O3680X0 Pregnancy with inconclusive fetal viability, not applicable or unspecified: Secondary | ICD-10-CM

## 2020-06-29 LAB — GC/CHLAMYDIA PROBE AMP (~~LOC~~) NOT AT ARMC
Chlamydia: NEGATIVE
Comment: NEGATIVE
Comment: NORMAL
Neisseria Gonorrhea: NEGATIVE

## 2020-06-29 LAB — BETA HCG QUANT (REF LAB): hCG Quant: 6557 m[IU]/mL

## 2020-06-29 NOTE — Progress Notes (Signed)
Pt here for stat BHCG following visit to MAU on 4/15. She reports no bleeding or abdominal pain. Pt informed that she will be called later today with results and plan of care. Pt agreed and stated that a detailed message can be left on voicemail if she does not answer. Pt was instructed to go to MAU if she develops heavy vaginal bleeding or abdominal pain. She voiced understanding.   1205  BHCG result (6257) reviewed by Dr. Alvester Morin who finds appropriate rise.  Pt needs [place] in 2 weeks for viability. Pt was called and informed of results as well as plan of care. [place] scheduled on 5/9 @ 10:00 am - pt to arrive @ 9:45 am with a full bladder. She voiced understanding of information provided and agreed to plan of care.

## 2020-06-29 NOTE — Progress Notes (Signed)
Attestation of Attending Supervision of clinical support staff: I agree with the care provided to this patient and was available for any consultation.  I have reviewed the RN's note and chart.   Federico Flake, MD, MPH, ABFM Attending Physician Faculty Practice- [facility]

## 2020-07-20 ENCOUNTER — Ambulatory Visit: Admission: RE | Admit: 2020-07-20 | Payer: No Typology Code available for payment source | Source: Ambulatory Visit

## 2021-07-19 IMAGING — US US OB < 14 WEEKS - US OB TV
1 series · 15 of 28 positions shown · non-contrast
Comparison: Ob ultrasound 10/01/2015

CLINICAL DATA: Cramping.

EXAM:
OBSTETRIC <14 WK US AND TRANSVAGINAL OB US
TECHNIQUE: Both transabdominal and transvaginal ultrasound examinations were
performed for complete evaluation of the gestation as well as the
maternal uterus, adnexal regions, and pelvic cul-de-sac.
Transvaginal technique was performed to assess early pregnancy.

[Series 1: us ob < 14 weeks - us ob tv · 15 of 60 slices shown]
[im 1/60]
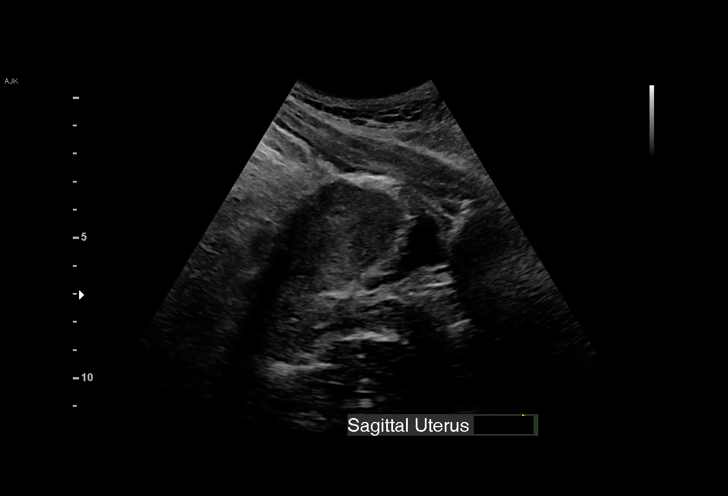
[im 5/60]
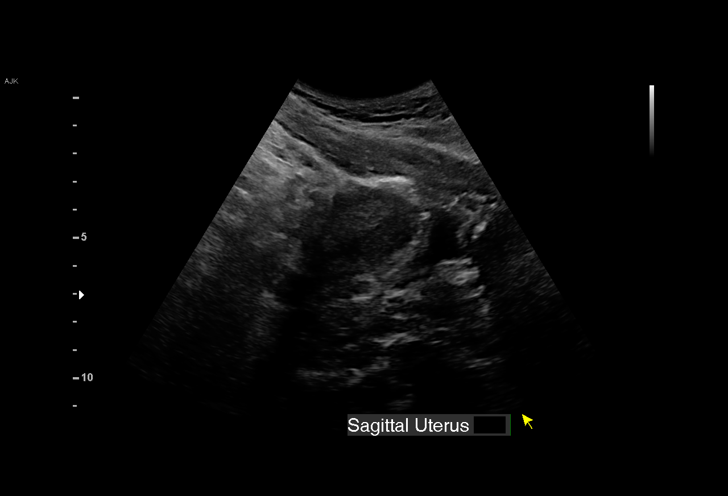
[im 9/60]
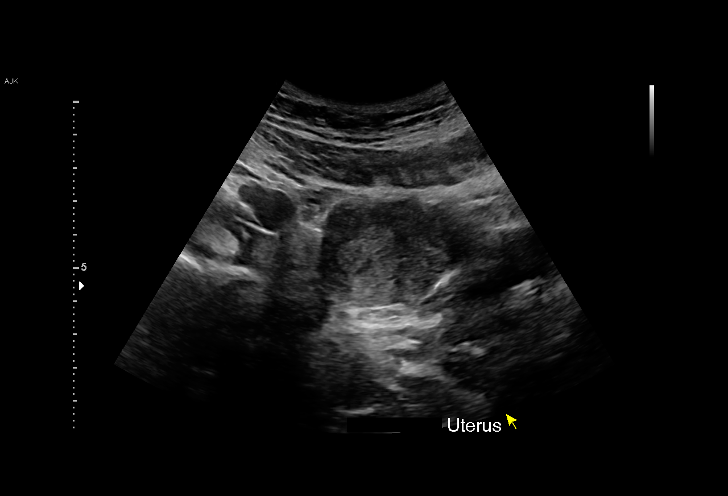
[im 14/60]
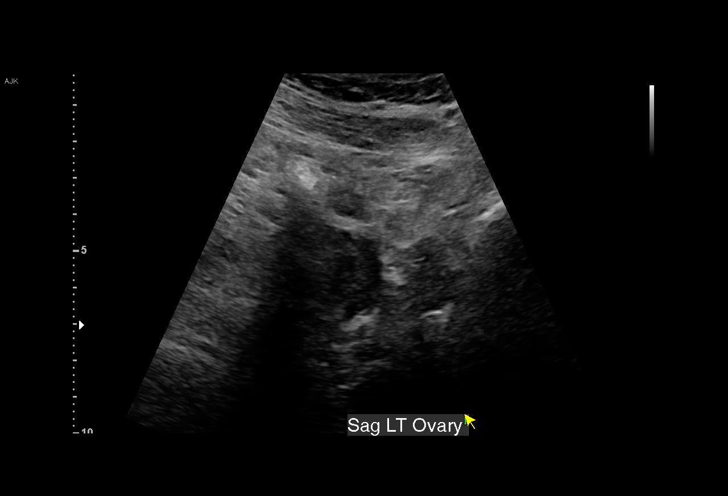
[im 18/60]
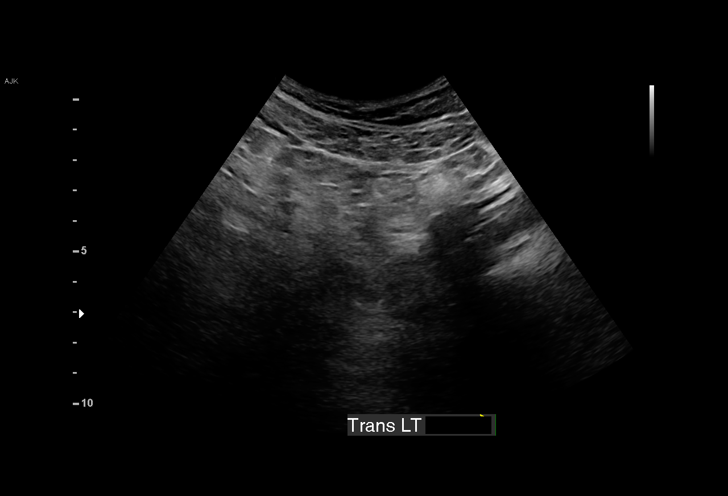
[im 22/60]
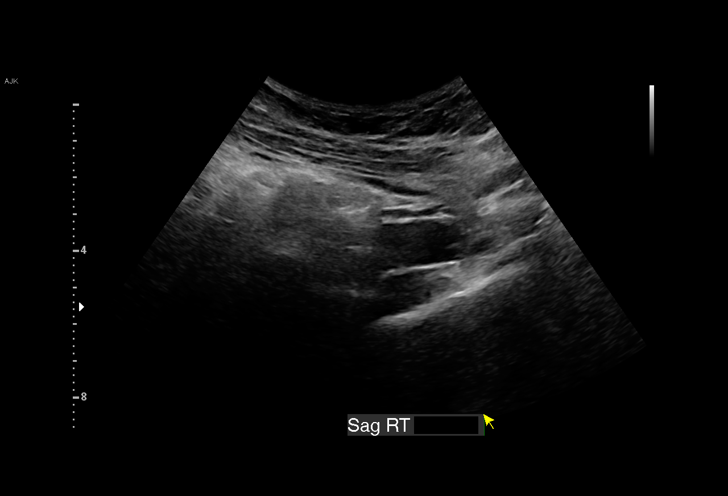
[im 27/60]
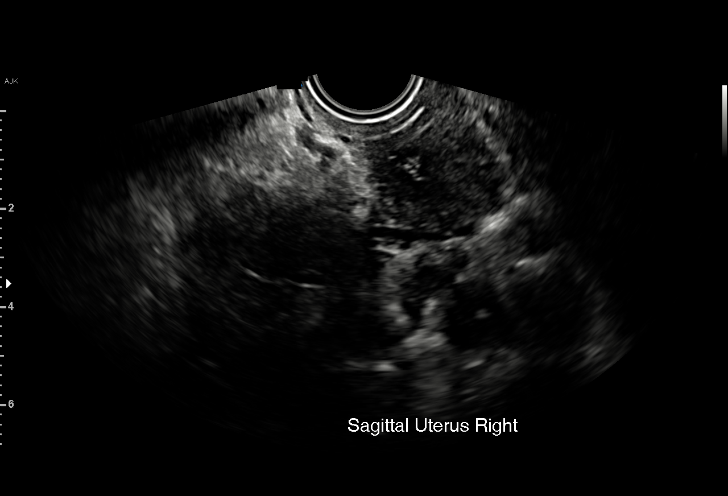
[im 31/60]
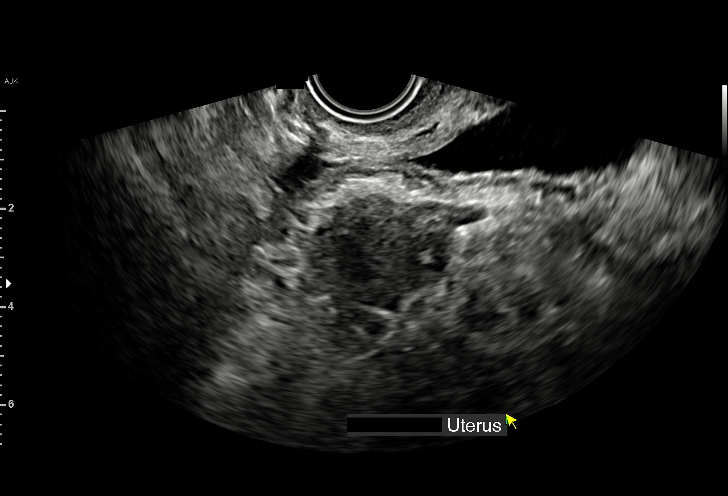
[im 33/60]
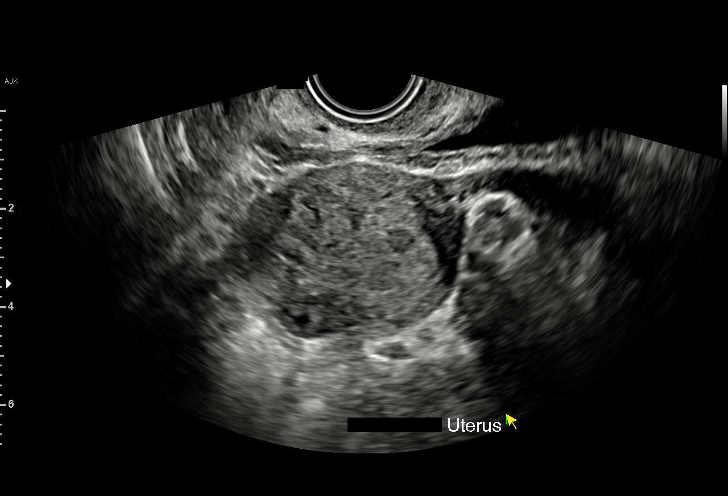
[im 38/60]
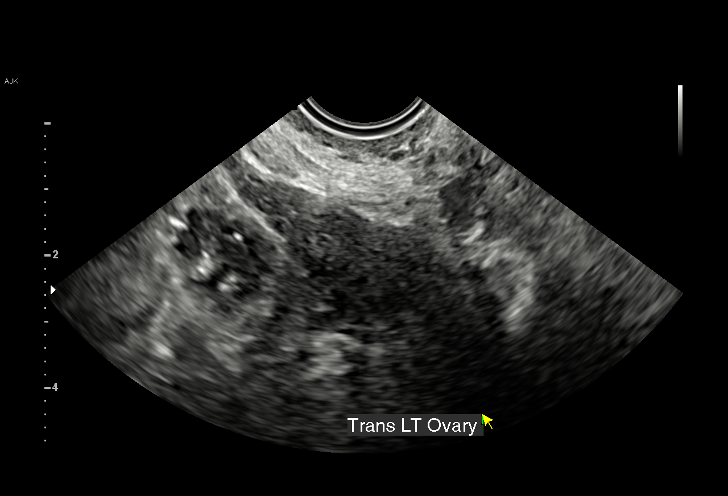
[im 42/60]
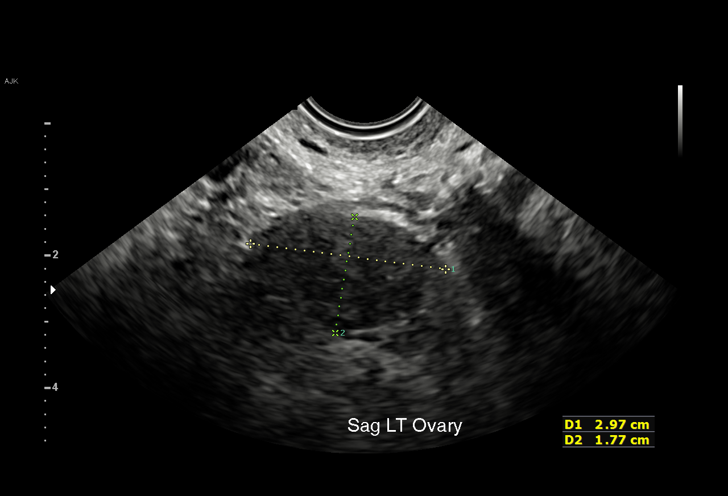
[im 46/60]
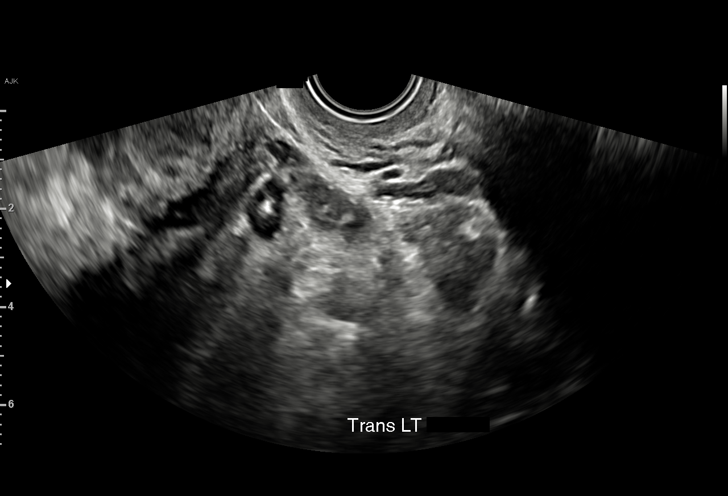
[im 51/60]
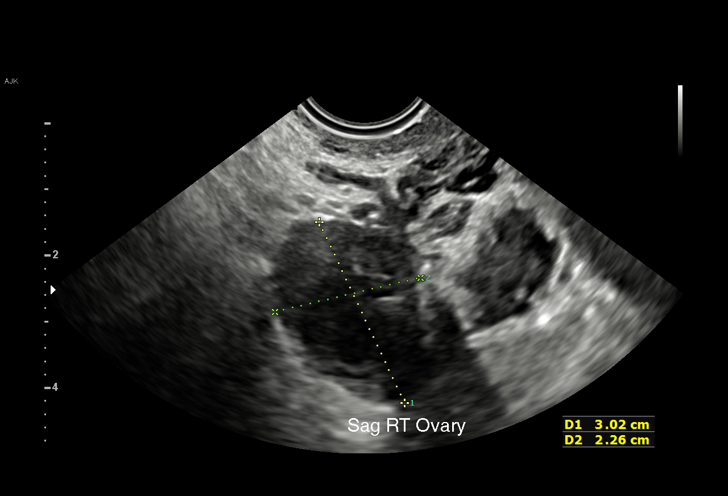
[im 55/60]
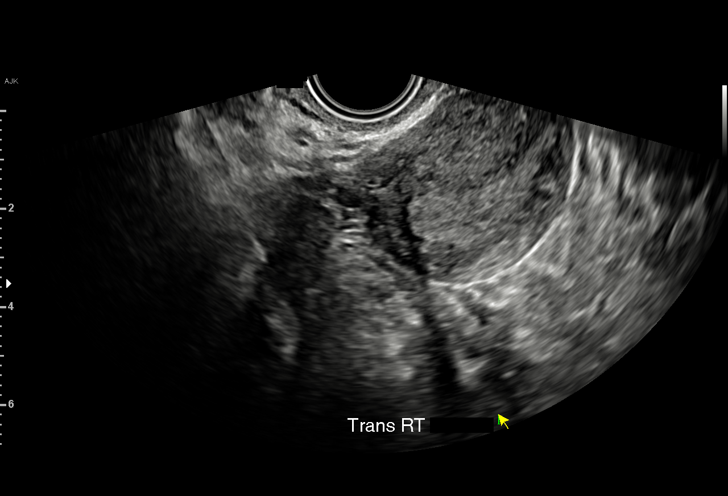
[im 60/60]
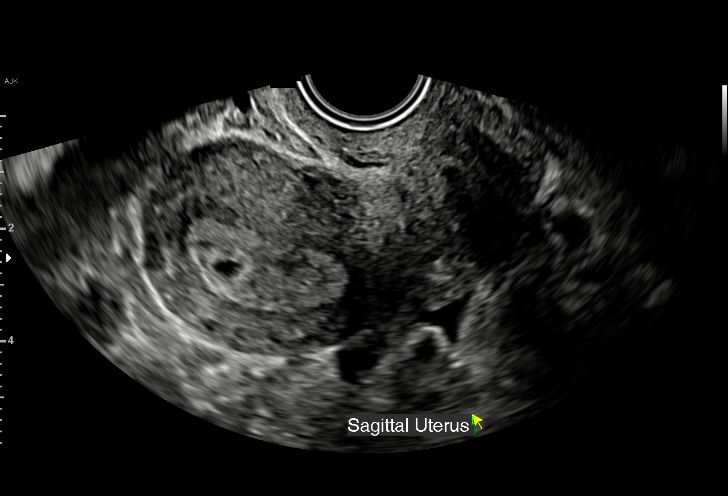

[15 of 28 positions shown; findings below may reference images not displayed]

FINDINGS: Intrauterine gestational sac: Single

Yolk sac:  Not Visualized.

Embryo:  Not Visualized.

MSD: 3.1 mm   5 w   0  d

Subchorionic hemorrhage:  None visualized.

Maternal uterus/adnexae: Normal appearance of the bilateral ovaries.
Trace free fluid in the pelvis.
IMPRESSION: Probable early intrauterine gestational sac, but no yolk sac, fetal
pole, or cardiac activity yet visualized. Recommend follow-up
quantitative B-HCG levels and follow-up US in 14 days to assess
viability. This recommendation follows SRU consensus guidelines:
Diagnostic Criteria for Nonviable Pregnancy Early in the First
Trimester. N Engl J Med 1167; [DATE].

## 2022-06-22 NOTE — Progress Notes (Unsigned)
   New Patient Office Visit  Subjective    Patient ID: Jennifer Campbell, female    DOB: June 01, 1990  Age: 32 y.o. MRN: [id number]  CC: No chief complaint on file.   HPI Jennifer Campbell presents to establish care ***          Outpatient Encounter Medications as of 06/23/2022  Medication Sig   albuterol (PROVENTIL HFA;VENTOLIN HFA) 108 (90 BASE) MCG/ACT inhaler Inhale 2 puffs into the lungs every 4 (four) hours as needed for wheezing or shortness of breath. (Patient not taking: Reported on 06/29/2020)   Prenatal Vit-Fe Fumarate-FA (PREPLUS) 27-1 MG TABS Take 1 tablet by mouth daily.   No facility-administered encounter medications on file as of 06/23/2022.    Past Medical History:  Diagnosis Date   Medical history non-contributory    Scoliosis    Substance induced mood disorder (HCC) 08/13/2014    Past Surgical History:  Procedure Laterality Date   EYE SURGERY     as a child to help alignmen   NO PAST SURGERIES      Family History  Problem Relation Age of Onset   Depression Mother    Asthma Mother    Rectal cancer Mother    Diabetes Maternal Aunt    Breast cancer Maternal Grandfather    Diabetes Maternal Grandfather    Depression Paternal Grandmother    Diabetes Paternal Grandmother    Cancer Paternal Grandmother    Lung cancer Maternal Uncle    Diabetes Paternal Aunt     Social History   Socioeconomic History   Marital status: Single    Spouse name: Not on file   Number of children: 0   Years of education: Not on file   Highest education level: Not on file  Occupational History   Not on file  Tobacco Use   Smoking status: Former    Packs/day: .15    Types: Cigarettes    Quit date: 11/12/2017    Years since quitting: 4.6   Smokeless tobacco: Never  Vaping Use   Vaping Use: Never used  Substance and Sexual Activity   Alcohol use: Yes    Comment: occasionally   Drug use: No    Comment: THC   Sexual activity: Yes    Birth  control/protection: None  Other Topics Concern   Not on file  Social History Narrative   Not on file   Social Determinants of Health   Financial Resource Strain: Not on file  Food Insecurity: Not on file  Transportation Needs: Not on file  Physical Activity: Not on file  Stress: Not on file  Social Connections: Not on file  Intimate Partner Violence: Not on file    ROS      Objective    There were no vitals taken for this visit.  Physical Exam  {Labs (Optional):23779}    Assessment & Plan:   Problem List Items Addressed This Visit   None   No follow-ups on file.   Clayborne Dana, NP

## 2022-06-23 ENCOUNTER — Ambulatory Visit (INDEPENDENT_AMBULATORY_CARE_PROVIDER_SITE_OTHER): Payer: Medicaid Other | Admitting: Family Medicine

## 2022-06-23 ENCOUNTER — Encounter: Payer: Self-pay | Admitting: Family Medicine

## 2022-06-23 VITALS — BP 135/73 | HR 75 | Ht 64.0 in | Wt 165.0 lb

## 2022-06-23 DIAGNOSIS — F17200 Nicotine dependence, unspecified, uncomplicated: Secondary | ICD-10-CM | POA: Insufficient documentation

## 2022-06-23 DIAGNOSIS — Z0000 Encounter for general adult medical examination without abnormal findings: Secondary | ICD-10-CM

## 2022-06-23 DIAGNOSIS — F32A Depression, unspecified: Secondary | ICD-10-CM

## 2022-06-23 DIAGNOSIS — F419 Anxiety disorder, unspecified: Secondary | ICD-10-CM | POA: Diagnosis not present

## 2022-06-23 NOTE — Assessment & Plan Note (Signed)
Following with [facility] for med management and counseling; scheduled to see them soon No SI/HI

## 2022-06-23 NOTE — Assessment & Plan Note (Signed)
Discussed smoking cessation, not quite ready Suggested maybe considering Wellbutrin for depression benefits as well. She will discuss with psych provider

## 2022-06-23 NOTE — Patient Instructions (Signed)
Thank you for choosing [facility] for your Primary Care needs. I am excited for the opportunity to partner with you to meet your health care goals. It was a pleasure meeting you today!  Information on diet, exercise, and health maintenance recommendations are listed below. This is information to help you be sure you are on track for optimal health and monitoring.   Please look over this and let us know if you have any questions or if you have completed any of the health maintenance outside of [facility] so that we can be sure your records are up to date.  ___________________________________________________________  MyChart:  For all urgent or time sensitive needs we ask that you please call the office to avoid delays. Our number is [phone]. MyChart is not constantly monitored and due to the large volume of messages a day, replies may take up to 72 business hours.  MyChart Policy: MyChart allows for you to see your visit notes, after visit summary, provider recommendations, lab and tests results, make an appointment, request refills, and contact your provider or the office for non-urgent questions or concerns. Providers are seeing patients during normal business hours and do not have built in time to review MyChart messages.  We ask that you allow a minimum of 3 business days for responses to MyChart messages. For this reason, please do not send urgent requests through MyChart. Please call the office at [phone]. New and ongoing conditions may require a visit. We have virtual and in-person visits available for your convenience.  Complex MyChart concerns may require a visit. Your provider may request you schedule a virtual or in-person visit to ensure we are providing the best care possible. MyChart messages sent after 11:00 AM on Friday will not be received by the provider until Monday morning.    Lab and Test Results: You will receive your lab and test  results on MyChart as soon as they are completed and results have been sent by the lab or testing facility. Due to this service, you will receive your results BEFORE your provider.  I review lab and test results each morning prior to seeing patients. Some results require collaboration with other providers to ensure you are receiving the most appropriate care. For this reason, we ask that you please allow a minimum of 3-5 business days from the time that ALL results have been received for your provider to receive and review lab and test results and contact you about these.  Most lab and test result comments from the provider will be sent through MyChart. Your provider may recommend changes to the plan of care, follow-up visits, repeat testing, ask questions, or request an office visit to discuss these results. You may reply directly to this message or call the office to provide information for the provider or set up an appointment. In some instances, you will be called with test results and recommendations. Please let us know if this is preferred and we will make note of this in your chart to provide this for you.    If you have not heard a response to your lab or test results in 5 business days from all results returning to MyChart, please call the office to let us know. We ask that you please avoid calling prior to this time unless there is an emergent concern. Due to high call volumes, this can delay the resulting process.  After Hours: For all non-emergency after hours needs, please   call the office at [phone] and select the option to reach the on-call  service. On-call services are shared between multiple [facility] offices and therefore it will not be possible to speak directly with your provider. On-call providers may provide medical advice and recommendations, but are unable to provide refills for maintenance medications.  For all emergency or urgent medical needs after normal business hours, we  recommend that you seek care at the closest Urgent Care or Emergency Department to ensure appropriate treatment in a timely manner.  [facility] has a 24 hour emergency room located on the ground floor for your convenience.   Urgent Concerns During the Business Day Providers are seeing patients from 8AM to 5PM with a busy schedule and are most often not able to respond to non-urgent calls until the end of the day or the next business day. If you should have URGENT concerns during the day, please call and speak to the nurse or schedule a same day appointment so that we can address your concern without delay.   Thank you, again, for choosing me as your health care partner. I appreciate your trust and look forward to learning more about you!   Makaylen Thieme B. Edwards Mckelvie, DNP, FNP-C  ___________________________________________________________  Health Maintenance Recommendations Screening Testing Mammogram Every 1-2 years based on history and risk factors Starting at age 50 Pap Smear Ages 21-39 every 3 years Ages 30-65 every 5 years with HPV testing More frequent testing may be required based on results and history Colon Cancer Screening Every 1-10 years based on test performed, risk factors, and history Starting at age 45 Bone Density Screening Every 2-10 years based on history Starting at age 65 for women Recommendations for men differ based on medication usage, history, and risk factors AAA Screening One time ultrasound Men 65-75 years old who have ever smoked Lung Cancer Screening Low Dose Lung CT every 12 months Age 50-80 years with a 20 pack-year smoking history who still smoke or who have quit within the last 15 years  Screening Labs Routine  Labs: Complete Blood Count (CBC), Complete Metabolic Panel (CMP), Cholesterol (Lipid Panel) Every 6-12 months based on history and medications May be recommended more frequently based on current conditions or previous results Hemoglobin  A1c Lab Every 3-12 months based on history and previous results Starting at age 45 or earlier with diagnosis of diabetes, high cholesterol, BMI >26, and/or risk factors Frequent monitoring for patients with diabetes to ensure blood sugar control Thyroid Panel  Every 6 months based on history, symptoms, and risk factors May be repeated more often if on medication HIV One time testing for all patients 13 and older May be repeated more frequently for patients with increased risk factors or exposure Hepatitis C One time testing for all patients 18 and older May be repeated more frequently for patients with increased risk factors or exposure Gonorrhea, Chlamydia Every 12 months for all sexually active persons 13-24 years Additional monitoring may be recommended for those who are considered high risk or who have symptoms PSA Men 40-54 years old with risk factors Additional screening may be recommended from age 55-69 based on risk factors, symptoms, and history  Vaccine Recommendations Tetanus Booster All adults every 10 years Flu Vaccine All patients 6 months and older every year COVID Vaccine All patients 12 years and older Initial dosing with booster May recommend additional booster based on age and health history HPV Vaccine 2 doses all patients age 9-26 Dosing may be considered   for patients over 26 Shingles Vaccine (Shingrix) 2 doses all adults 50 years and older Pneumonia (Pneumovax 23) All adults 65 years and older May recommend earlier dosing based on health history Pneumonia (Prevnar 13) All adults 65 years and older Dosed 1 year after Pneumovax 23 Pneumonia (Prevnar 20) All adults 65 years and older (adults 19-64 with certain conditions or risk factors) 1 dose  For those who have not received Prevnar 13 vaccine previously   Additional Screening, Testing, and Vaccinations may be recommended on an individualized basis based on family history, health history, risk  factors, and/or exposure.  __________________________________________________________  Diet Recommendations for All Patients  I recommend that all patients maintain a diet low in saturated fats, carbohydrates, and cholesterol. While this can be challenging at first, it is not impossible and small changes can make big differences.  Things to try: Decreasing the amount of soda, sweet tea, and/or juice to one or less per day and replace with water While water is always the first choice, if you do not like water you may consider adding a water additive without sugar to improve the taste other sugar free drinks Replace potatoes with a brightly colored vegetable  Use healthy oils, such as canola oil or olive oil, instead of butter or hard margarine Limit your bread intake to two pieces or less a day Replace regular pasta with low carb pasta options Bake, broil, or grill foods instead of frying Monitor portion sizes  Eat smaller, more frequent meals throughout the day instead of large meals  An important thing to remember is, if you love foods that are not great for your health, you don't have to give them up completely. Instead, allow these foods to be a reward when you have done well. Allowing yourself to still have special treats every once in a while is a nice way to tell yourself thank you for working hard to keep yourself healthy.   Also remember that every day is a new day. If you have a bad day and "fall off the wagon", you can still climb right back up and keep moving along on your journey!  We have resources available to help you!  Some websites that may be helpful include: www.MyPlate.gov  Www.VeryWellFit.com _____________________________________________________________  Activity Recommendations for All Patients  I recommend that all adults get at least 20 minutes of moderate physical activity that elevates your heart rate at least 5 days out of the week.  Some examples  include: Walking or jogging at a pace that allows you to carry on a conversation Cycling (stationary bike or outdoors) Water aerobics Yoga Weight lifting Dancing If physical limitations prevent you from putting stress on your joints, exercise in a pool or seated in a chair are excellent options.  Do determine your MAXIMUM heart rate for activity: 220 - YOUR AGE = MAX Heart Rate   Remember! Do not push yourself too hard.  Start slowly and build up your pace, speed, weight, time in exercise, etc.  Allow your body to rest between exercise and get good sleep. You will need more water than normal when you are exerting yourself. Do not wait until you are thirsty to drink. Drink with a purpose of getting in at least 8, 8 ounce glasses of water a day plus more depending on how much you exercise and sweat.    If you begin to develop dizziness, chest pain, abdominal pain, jaw pain, shortness of breath, headache, vision changes, lightheadedness, or other concerning symptoms,   stop the activity and allow your body to rest. If your symptoms are severe, seek emergency evaluation immediately. If your symptoms are concerning, but not severe, please let us know so that we can recommend further evaluation.     

## 2022-07-25 ENCOUNTER — Encounter: Payer: Medicaid Other | Admitting: Obstetrics and Gynecology

## 2022-08-15 ENCOUNTER — Encounter: Payer: Medicaid Other | Admitting: Family Medicine

## 2022-11-17 DIAGNOSIS — M25561 Pain in right knee: Secondary | ICD-10-CM | POA: Diagnosis not present

## 2022-12-04 ENCOUNTER — Ambulatory Visit
Admission: EM | Admit: 2022-12-04 | Discharge: 2022-12-04 | Disposition: A | Discharge status: Home / Self Care | Payer: Medicaid Other | Attending: Internal Medicine | Admitting: Internal Medicine

## 2022-12-04 DIAGNOSIS — M62838 Other muscle spasm: Secondary | ICD-10-CM | POA: Diagnosis not present

## 2022-12-04 DIAGNOSIS — J209 Acute bronchitis, unspecified: Secondary | ICD-10-CM | POA: Diagnosis not present

## 2022-12-04 MED ORDER — CYCLOBENZAPRINE HCL 10 MG PO TABS
10.0000 mg | ORAL_TABLET | Freq: Two times a day (BID) | ORAL | 0 refills | Status: DC | PRN
Start: 2022-12-04 — End: 2023-04-19

## 2022-12-04 MED ORDER — PROMETHAZINE-DM 6.25-15 MG/5ML PO SYRP: 5.0000 mL | ORAL_SOLUTION | Freq: Four times a day (QID) | ORAL | 0 refills | Status: DC | PRN

## 2022-12-04 MED ORDER — AMOXICILLIN-POT CLAVULANATE 875-125 MG PO TABS: 1.0000 | ORAL_TABLET | Freq: Two times a day (BID) | ORAL | 0 refills | Status: DC

## 2022-12-04 MED ORDER — KETOROLAC TROMETHAMINE 30 MG/ML IJ SOLN
30.0000 mg | Freq: Once | INTRAMUSCULAR | Status: AC
  Administered 2022-12-04: 30 mg via INTRAMUSCULAR

## 2022-12-04 NOTE — Discharge Instructions (Addendum)
You were given a Toradol injection in clinic today. Do not take any over the counter NSAID's such as Advil, ibuprofen, Aleve, or naproxen for 24 hours. You may take tylenol if needed.  Start Flexeril as needed for your shoulder/muscle pain.  Please of this medication can make you drowsy.  No alcohol or driving while on this medication.  Promethazine DM as needed for cough.  Please note this medication can also make you drowsy.  No driving or alcohol while on this medication.  Do not take the Flexeril and Promethazine DM together due to sedation risk.  Start Augmentin twice daily for 7 days for your bronchitis.  Heat to your shoulder as needed.  Lots of rest and fluids.  Please follow-up with your PCP in 2 days for recheck.  Please go to the emergency room if you develop any worsening symptoms.  I hope you feel better soon!

## 2022-12-04 NOTE — ED Provider Notes (Signed)
[facility]    CSN: [id number] Arrival date & time: 12/04/22  1244      History   Chief Complaint Chief Complaint  Patient presents with   Cough   Arm Injury    HPI Jennifer Campbell is a 32 y.o. female presents for cough and shoulder pain.  Patient reports 2 weeks of a persistent productive cough with purulent sputum.  States it initially started out as a "cold" but the cough is now persistent.  Denies any fevers, sore throat, ear pain, body aches, shortness of breath.  No asthma or smoking history.  She has been taking Mucinex, NyQuil OTC for symptoms.  Mother has also been sick as well.  In addition 2 days ago she almost fell out of a chair and used her right arm to catch herself.  In the process she feels she pulled her right shoulder.  Since then she has been having a constant posterior shoulder spasm/pain that is worse with movement.  Denies any numbness/tingling/weakness of that upper extremity.  Denies direct injury including fall.  She is been taking naproxen, last dose yesterday.  No history of injuries or surgeries to the shoulder in the past.  No other concerns at this time.   Cough Arm Injury   Past Medical History:  Diagnosis Date   Anxiety    Depression    Heart murmur    Medical history non-contributory    Scoliosis    Substance induced mood disorder (HCC) 08/13/2014    Patient Active Problem List   Diagnosis Date Noted   Tobacco dependence 06/23/2022   Anxiety and depression 06/23/2022   Over weight 11/15/2018   Situational anxiety 11/15/2018   Premenstrual dysphoria 11/15/2018   Hand injury, right, sequela 10/16/2018   Breast cancer screening 10/16/2018   Chronic tension headaches 06/09/2011    Past Surgical History:  Procedure Laterality Date   EYE SURGERY     as a child to help alignmen    OB History     Gravida  2   Para      Term      Preterm      AB      Living         SAB      IAB      Ectopic       Multiple      Live Births               Home Medications    Prior to Admission medications   Medication Sig Start Date End Date Taking? Authorizing Provider  amoxicillin-clavulanate (AUGMENTIN) 875-125 MG tablet Take 1 tablet by mouth every 12 (twelve) hours. 12/04/22  Yes Radford Pax, NP  cyclobenzaprine (FLEXERIL) 10 MG tablet Take 1 tablet (10 mg total) by mouth 2 (two) times daily as needed for muscle spasms. 12/04/22  Yes Radford Pax, NP  gabapentin (NEURONTIN) 300 MG capsule Take 300 mg by mouth 3 (three) times daily. 07/31/22  Yes [provider]  meloxicam (MOBIC) 7.5 MG tablet Take 7.5 mg by mouth daily as needed. 11/07/22  Yes [provider]  promethazine-dextromethorphan (PROMETHAZINE-DM) 6.25-15 MG/5ML syrup Take 5 mLs by mouth 4 (four) times daily as needed for cough. 12/04/22  Yes Radford Pax, NP  FLUoxetine (PROZAC) 40 MG capsule Take 40 mg by mouth every morning. 06/13/22   [provider]  QUEtiapine (SEROQUEL) 100 MG tablet Take 100 mg by mouth at bedtime. 06/13/22  [provider]    Family History Family History  Problem Relation Age of Onset   Depression Mother    Asthma Mother    Rectal cancer Mother    Anxiety disorder Mother    Arthritis Mother    Cancer Mother    Diabetes Maternal Aunt    Breast cancer Maternal Grandfather    Diabetes Maternal Grandfather    Depression Paternal Grandmother    Diabetes Paternal Grandmother    Cancer Paternal Grandmother    Lung cancer Maternal Uncle    Diabetes Paternal Aunt     Social History Social History   Tobacco Use   Smoking status: Every Day    Current packs/day: 0.00    Types: Cigarettes    Last attempt to quit: 11/12/2017    Years since quitting: 5.0   Smokeless tobacco: Never  Vaping Use   Vaping status: Never Used  Substance Use Topics   Alcohol use: Not Currently    Comment: occasionally   Drug use: Yes    Frequency: 14.0 times per week    Types: Marijuana     Comment: THC     Allergies   Patient has no known allergies.   Review of Systems Review of Systems  Respiratory:  Positive for cough.   Musculoskeletal:        Right shoulder pain      Physical Exam Triage Vital Signs ED Triage Vitals [12/04/22 1345]  Encounter Vitals Group     BP 119/83     Systolic BP Percentile      Diastolic BP Percentile      Pulse Rate 71     Resp 16     Temp 98.2 F (36.8 C)     Temp Source Oral     SpO2 97 %     Weight 180 lb (81.6 kg)     Height 5\' 6"  (1.676 m)     Head Circumference      Peak Flow      Pain Score 10     Pain Loc      Pain Education      Exclude from Growth Chart    No data found.  Updated Vital Signs BP 119/83 (BP Location: Left Arm)   Pulse 71   Temp 98.2 F (36.8 C) (Oral)   Resp 16   Ht 5\' 6"  (1.676 m)   Wt 180 lb (81.6 kg)   LMP 11/27/2022 (Approximate)   SpO2 97%   Breastfeeding No   BMI 29.05 kg/m   Visual Acuity Right Eye Distance:   Left Eye Distance:   Bilateral Distance:    Right Eye Near:   Left Eye Near:    Bilateral Near:     Physical Exam Vitals and nursing note reviewed.  Constitutional:      General: She is not in acute distress.    Appearance: She is well-developed. She is not ill-appearing.  HENT:     Head: Normocephalic and atraumatic.     Right Ear: Tympanic membrane and ear canal normal.     Left Ear: Tympanic membrane and ear canal normal.     Nose: No congestion or rhinorrhea.     Mouth/Throat:     Mouth: Mucous membranes are moist.     Pharynx: Oropharynx is clear. Uvula midline. No posterior oropharyngeal erythema.     Tonsils: No tonsillar exudate or tonsillar abscesses.  Eyes:     Conjunctiva/sclera: Conjunctivae normal.     Pupils: Pupils  are equal, round, and reactive to light.  Cardiovascular:     Rate and Rhythm: Normal rate and regular rhythm.     Heart sounds: Normal heart sounds.  Pulmonary:     Effort: Pulmonary effort is normal.     Breath sounds:  Normal breath sounds.  Musculoskeletal:       Arms:     Cervical back: Normal range of motion and neck supple.     Comments: There is no swelling or ecchymosis of the right anterior or posterior shoulder.  Moderate tenderness to palpation/spasms to the trapezius muscle that extends daily to the right rhomboid.  No anterior tenderness to palpation to shoulder.  No cervical or thoracic spinal tenderness with palpation.  Strength is 5 out of 5 bilateral extremities.  Lymphadenopathy:     Cervical: No cervical adenopathy.  Skin:    General: Skin is warm and dry.  Neurological:     General: No focal deficit present.     Mental Status: She is alert and oriented to person, place, and time.  Psychiatric:        Mood and Affect: Mood normal.        Behavior: Behavior normal.      UC Treatments / Results  Labs (all labs ordered are listed, but only abnormal results are displayed) Labs Reviewed - No data to display  EKG   Radiology No results found.  Procedures Procedures (including critical care time)  Medications Ordered in UC Medications  ketorolac (TORADOL) 30 MG/ML injection 30 mg (30 mg Intramuscular Given 12/04/22 1409)    Initial Impression / Assessment and Plan / UC Course  I have reviewed the triage vital signs and the nursing notes.  Pertinent labs & imaging results that were available during my care of the patient were reviewed by me and considered in my medical decision making (see chart for details).     Reviewed exam and symptoms with patient.  Discussed bronchitis, start Augmentin and Promethazine DM.  Discussed trapezius muscle strain/spasm.  Trial of Flexeril as needed.  She was given Toradol injection in clinic and monitored for 10 minutes and tolerated well.  She was instructed no NSAIDs for 24 hours and she verbalized understanding.  Heat to the shoulder and rest.  Medication side effect profiles were reviewed.  PCP follow-up 2 days for recheck.  ER precautions  reviewed and patient verbalized understanding. Final Clinical Impressions(s) / UC Diagnoses   Final diagnoses:  Trapezius muscle spasm  Acute bronchitis, unspecified organism     Discharge Instructions      You were given a Toradol injection in clinic today. Do not take any over the counter NSAID's such as Advil, ibuprofen, Aleve, or naproxen for 24 hours. You may take tylenol if needed.  Start Flexeril as needed for your shoulder/muscle pain.  Please of this medication can make you drowsy.  No alcohol or driving while on this medication.  Promethazine DM as needed for cough.  Please note this medication can also make you drowsy.  No driving or alcohol while on this medication.  Start Augmentin twice daily for 7 days for your bronchitis.  Heat to your shoulder as needed.  Lots of rest and fluids.  Please follow-up with your PCP in 2 days for recheck.  Please go to the emergency room if you develop any worsening symptoms.  I hope you feel better soon!     ED Prescriptions     Medication Sig Dispense Auth. Provider   promethazine-dextromethorphan (  PROMETHAZINE-DM) 6.25-15 MG/5ML syrup Take 5 mLs by mouth 4 (four) times daily as needed for cough. 118 mL Radford Pax, NP   cyclobenzaprine (FLEXERIL) 10 MG tablet Take 1 tablet (10 mg total) by mouth 2 (two) times daily as needed for muscle spasms. 10 tablet Radford Pax, NP   amoxicillin-clavulanate (AUGMENTIN) 875-125 MG tablet Take 1 tablet by mouth every 12 (twelve) hours. 14 tablet Radford Pax, NP      PDMP not reviewed this encounter.   Radford Pax, NP 12/04/22 1414

## 2022-12-04 NOTE — ED Triage Notes (Signed)
Patient here today with c/o cough and stuffy nose X 2 weeks. She has taken Nyquil, Mucinex, and Tylenol Cold and flu with some relief but the cough is still there. He mother has also bee sick.   She is also here today with c/o right shoulder pain X 2 days after jarring arm on a desk to catch her fall. She was sitting down when she almost fell out of the chair. She has been taking Aleve and Meloxicam with temporary relief.

## 2022-12-08 ENCOUNTER — Other Ambulatory Visit: Payer: Self-pay | Admitting: Family Medicine

## 2022-12-09 ENCOUNTER — Other Ambulatory Visit: Payer: Self-pay

## 2022-12-09 MED ORDER — GABAPENTIN 300 MG PO CAPS
300.0000 mg | ORAL_CAPSULE | Freq: Three times a day (TID) | ORAL | 3 refills | Status: DC
  Filled 2022-12-09: qty 90, 30d supply, fill #0

## 2022-12-19 ENCOUNTER — Other Ambulatory Visit: Payer: Self-pay

## 2023-04-19 ENCOUNTER — Ambulatory Visit
Admission: RE | Admit: 2023-04-19 | Discharge: 2023-04-19 | Disposition: A | Discharge status: Home / Self Care | Payer: Medicaid Other | Source: Ambulatory Visit | Attending: Family Medicine | Admitting: Family Medicine

## 2023-04-19 VITALS — BP 148/74 | HR 75 | Temp 98.6°F | Resp 18

## 2023-04-19 DIAGNOSIS — J4530 Mild persistent asthma, uncomplicated: Secondary | ICD-10-CM | POA: Diagnosis not present

## 2023-04-19 MED ORDER — PROMETHAZINE-DM 6.25-15 MG/5ML PO SYRP: 5.0000 mL | ORAL_SOLUTION | Freq: Three times a day (TID) | ORAL | 0 refills | Status: DC | PRN

## 2023-04-19 MED ORDER — ALBUTEROL SULFATE HFA 108 (90 BASE) MCG/ACT IN AERS: 1.0000 | INHALATION_SPRAY | Freq: Four times a day (QID) | RESPIRATORY_TRACT | 0 refills | Status: DC | PRN

## 2023-04-19 MED ORDER — AZITHROMYCIN 250 MG PO TABS: ORAL_TABLET | ORAL | 0 refills | Status: DC

## 2023-04-19 MED ORDER — PREDNISONE 20 MG PO TABS: ORAL_TABLET | ORAL | 0 refills | Status: DC

## 2023-04-19 NOTE — ED Triage Notes (Signed)
 Pt reports cough, shortness of breath, wheezing, chest congestion and tightness x 1 week. Albuterol  inhaler and Mucinex  gives relief "for a little bit".

## 2023-04-19 NOTE — ED Provider Notes (Signed)
 [facility]  Note:  This document was prepared using Conservation officer, historic buildings and may include unintentional dictation errors.  MRN: [id number] DOB: 1990/12/22  Subjective:   Jennifer Campbell is a 33 y.o. female presenting for 1 week history of persistent coughing, shortness of breath, wheezing, chest congestion and chest tightness.  Has asthma, needs a refill of her inhaler.  Patient is a daily smoker.  Takes albuterol  as needed.    No Known Allergies  Past Medical History:  Diagnosis Date   Anxiety    Depression    Heart murmur    Medical history non-contributory    Scoliosis    Substance induced mood disorder (HCC) 08/13/2014     Past Surgical History:  Procedure Laterality Date   EYE SURGERY     as a child to help alignmen    Family History  Problem Relation Age of Onset   Depression Mother    Asthma Mother    Rectal cancer Mother    Anxiety disorder Mother    Arthritis Mother    Cancer Mother    Diabetes Maternal Aunt    Breast cancer Maternal Grandfather    Diabetes Maternal Grandfather    Depression Paternal Grandmother    Diabetes Paternal Grandmother    Cancer Paternal Grandmother    Lung cancer Maternal Uncle    Diabetes Paternal Aunt     Social History   Tobacco Use   Smoking status: Every Day    Current packs/day: 0.00    Types: Cigarettes    Last attempt to quit: 11/12/2017    Years since quitting: 5.4   Smokeless tobacco: Never  Vaping Use   Vaping status: Never Used  Substance Use Topics   Alcohol use: Not Currently    Comment: occasionally   Drug use: Yes    Frequency: 14.0 times per week    Types: Marijuana    Comment: THC    ROS   Objective:   Vitals: BP (!) 148/74 (BP Location: Left Arm)   Pulse 75   Temp 98.6 F (37 C) (Oral)   Resp 18   LMP 04/18/2023 (Exact Date)   SpO2 96%   Physical Exam Constitutional:      General: She is not in acute distress.    Appearance: Normal  appearance. She is well-developed and normal weight. She is not ill-appearing, toxic-appearing or diaphoretic.  HENT:     Head: Normocephalic and atraumatic.     Right Ear: Tympanic membrane, ear canal and external ear normal. No drainage or tenderness. No middle ear effusion. There is no impacted cerumen. Tympanic membrane is not erythematous or bulging.     Left Ear: Tympanic membrane, ear canal and external ear normal. No drainage or tenderness.  No middle ear effusion. There is no impacted cerumen. Tympanic membrane is not erythematous or bulging.     Nose: Nose normal. No congestion or rhinorrhea.     Mouth/Throat:     Mouth: Mucous membranes are moist. No oral lesions.     Pharynx: No pharyngeal swelling, oropharyngeal exudate, posterior oropharyngeal erythema or uvula swelling.     Tonsils: No tonsillar exudate or tonsillar abscesses.  Eyes:     General: No scleral icterus.       Right eye: No discharge.        Left eye: No discharge.     Extraocular Movements: Extraocular movements intact.     Right eye: Normal extraocular motion.  Left eye: Normal extraocular motion.     Conjunctiva/sclera: Conjunctivae normal.  Cardiovascular:     Rate and Rhythm: Normal rate and regular rhythm.     Heart sounds: Normal heart sounds. No murmur heard.    No friction rub. No gallop.  Pulmonary:     Effort: Pulmonary effort is normal. No respiratory distress.     Breath sounds: No stridor. Wheezing and rhonchi present. No rales.  Chest:     Chest wall: No tenderness.  Musculoskeletal:     Cervical back: Normal range of motion and neck supple.  Lymphadenopathy:     Cervical: No cervical adenopathy.  Skin:    General: Skin is warm and dry.  Neurological:     General: No focal deficit present.     Mental Status: She is alert and oriented to person, place, and time.  Psychiatric:        Mood and Affect: Mood normal.        Behavior: Behavior normal.     Assessment and Plan :   PDMP  not reviewed this encounter.  1. Mild persistent asthmatic bronchitis without complication    Deferred respiratory testing.  Recommended prednisone , azithromycin  and supportive care for her asthmatic bronchitis.   Use supportive care otherwise.  Counseled patient on potential for adverse effects with medications prescribed/recommended today, ER and return-to-clinic precautions discussed, patient verbalized understanding.    Christopher Savannah, [place] 04/19/23 1343

## 2023-05-29 ENCOUNTER — Other Ambulatory Visit (HOSPITAL_COMMUNITY)
Admission: RE | Admit: 2023-05-29 | Discharge: 2023-05-29 | Disposition: A | Discharge status: Home / Self Care | Source: Ambulatory Visit | Attending: Family Medicine | Admitting: Family Medicine

## 2023-05-29 ENCOUNTER — Ambulatory Visit (INDEPENDENT_AMBULATORY_CARE_PROVIDER_SITE_OTHER): Admitting: Family Medicine

## 2023-05-29 VITALS — BP 130/84 | HR 90 | Ht 66.0 in | Wt 180.0 lb

## 2023-05-29 DIAGNOSIS — Z124 Encounter for screening for malignant neoplasm of cervix: Secondary | ICD-10-CM

## 2023-05-29 DIAGNOSIS — N939 Abnormal uterine and vaginal bleeding, unspecified: Secondary | ICD-10-CM

## 2023-05-29 DIAGNOSIS — F32A Depression, unspecified: Secondary | ICD-10-CM

## 2023-05-29 DIAGNOSIS — F419 Anxiety disorder, unspecified: Secondary | ICD-10-CM

## 2023-05-29 LAB — POCT URINE PREGNANCY: Preg Test, Ur: NEGATIVE

## 2023-05-29 MED ORDER — HYDROXYZINE HCL 25 MG PO TABS: 25.0000 mg | ORAL_TABLET | Freq: Three times a day (TID) | ORAL | 1 refills | Status: AC | PRN

## 2023-05-29 MED ORDER — SERTRALINE HCL 50 MG PO TABS: 50.0000 mg | ORAL_TABLET | Freq: Every day | ORAL | 3 refills | Status: AC

## 2023-05-29 NOTE — Progress Notes (Signed)
 Acute Office Visit  Subjective:     Patient ID: Jennifer Campbell, female    DOB: August 17, 1990, 33 y.o.   MRN: [id number]  Chief Complaint  Patient presents with   Vaginal Bleeding    HPI Patient is in today for abnormal vaginal bleeding.  Discussed the use of AI scribe software for clinical note transcription with the patient, who gave verbal consent to proceed.  History of Present Illness Jennifer Campbell is a 33 year old female who presents with irregular vaginal bleeding.  She has been experiencing irregular vaginal bleeding. Her last menstrual period began around March 1st and lasted for about four days, which is typical for her. Approximately one week after her period ended, she began spotting, which continued for about a week. On Saturday, she experienced a sudden episode of heavy bleeding, which she initially thought was the start of another period. However, the bleeding stopped by the next morning. This pattern of brief, heavy bleeding episodes has occurred for the past two days, with no bleeding in the mornings.  No cramping, pelvic pain, back pain, or changes in urination or bowel movements. No itching, burning, or unusual discharge. A recent urine pregnancy test was negative, and she does not believe she is pregnant. She has not experienced irregular periods in the past and describes her menstrual cycle as 'clockwork'.  She is sexually active but has never had a sexually transmitted disease. She mentions a history of being advised to have annual Pap smears due to a past concern about cervical cancer, although her last Pap smear in 2020 was normal. She has not had a Pap smear since then.   At the end of the visit she mentions worsening anxiety. She has not had success with Prozac in the past and is open to trying something different. No SI/HI. She does of episodes of heightened anxiety, unsure if level of true panic attack. Also having some trouble sleeping, but cannot  remember what sleep aid she has used in the past.       05/29/2023    4:35 PM 06/23/2022    3:43 PM 10/16/2018   10:46 AM  PHQ9 SCORE ONLY  PHQ-9 Total Score 18 12 7       05/29/2023    4:35 PM 06/23/2022    3:44 PM 10/16/2018   10:46 AM  GAD 7 : Generalized Anxiety Score  Nervous, Anxious, on Edge 3 3 1   Control/stop worrying 3 2 1   Worry too much - different things 3 2 1   Trouble relaxing 3 2 1   Restless 2 1 1   Easily annoyed or irritable 3 3 1   Afraid - awful might happen 2 1 0  Total GAD 7 Score 19 14 6   Anxiety Difficulty Extremely difficult Extremely difficult Somewhat difficult      ROS All review of systems negative except what is listed in the HPI      Objective:    BP 130/84   Pulse 90   Ht 5\' 6"  (1.676 m)   Wt 180 lb (81.6 kg)   SpO2 100%   BMI 29.05 kg/m    Physical Exam Vitals reviewed. Exam conducted with a chaperone present.  Constitutional:      Appearance: Normal appearance.  Genitourinary:    Exam position: Lithotomy position.     Vagina: No signs of injury and foreign body. No vaginal discharge, erythema, tenderness, bleeding, lesions or prolapsed vaginal walls.     Cervix: No cervical motion tenderness, discharge,  friability, lesion, erythema or cervical bleeding.     Uterus: Not enlarged and not tender.      Adnexa:        Right: No mass, tenderness or fullness.         Left: No mass, tenderness or fullness.    Neurological:     Mental Status: She is alert and oriented to person, place, and time.  Psychiatric:        Mood and Affect: Mood normal.        Behavior: Behavior normal.        Thought Content: Thought content normal.        Judgment: Judgment normal.     Results for orders placed or performed in visit on 05/29/23  POCT urine pregnancy  Result Value Ref Range   Preg Test, Ur Negative Negative        Assessment & Plan:   Problem List Items Addressed This Visit       Active Problems   Anxiety and depression   No  SI/HI. She is open to trying another SSRI (no response from Prozac in the past). Will try Zoloft and PRN hydroxyzine - discussed meds and info added to AVS.  Follow-up in 6 weeks to reassess.       Relevant Medications   sertraline (ZOLOFT) 50 MG tablet   hydrOXYzine (ATARAX) 25 MG tablet   Other Visit Diagnoses       Abnormal uterine bleeding    -  Primary Urine pregnancy test negative. Unremarkable pelvic exam today. Pap done. Adding blood work. Encouraged her to monitor cycles closely. If irregular bleeding persists and workup is negative - consider [place] and referral to GYN.  Patient aware of signs/symptoms requiring further/urgent evaluation.    Relevant Orders   POCT urine pregnancy   Cytology - PAP( [facility])   Follicle stimulating hormone   Prolactin   TSH   Estradiol   hCG, quantitative, pregnancy     Screening for cervical cancer       Relevant Orders   Cytology - PAP( [place])          Meds ordered this encounter  Medications   sertraline (ZOLOFT) 50 MG tablet    Sig: Take 1 tablet (50 mg total) by mouth daily.    Dispense:  30 tablet    Refill:  3    Supervising Provider:   Danise Edge A [[id number]]   hydrOXYzine (ATARAX) 25 MG tablet    Sig: Take 1 tablet (25 mg total) by mouth 3 (three) times daily as needed.    Dispense:  30 tablet    Refill:  1    Supervising Provider:   Danise Edge A [[id number]]    Return in about 6 weeks (around 07/10/2023) for  mood follow-up.  Clayborne Dana, NP

## 2023-05-29 NOTE — Assessment & Plan Note (Signed)
 No SI/HI. She is open to trying another SSRI (no response from Prozac in the past). Will try Zoloft and PRN hydroxyzine - discussed meds and info added to AVS.  Follow-up in 6 weeks to reassess.

## 2023-05-29 NOTE — Patient Instructions (Signed)
 Taking the medicine as directed and not missing any doses is one of the best things you can do to treat your anxiety/depression.  Here are some things to keep in mind: Side effects (stomach upset, some increased anxiety) may happen before you notice a benefit.  These side effects typically go away over time. Changes to your dose of medicine or a change in medication all together is sometimes necessary Many people will notice an improvement within two weeks but the full effect of the medication can take up to 4-6 weeks Stopping the medication when you start feeling better often results in a return of symptoms. Most people need to be on medication at least 6-12 months If you start having thoughts of hurting yourself or others after starting this medicine, please call me immediately.

## 2023-05-30 ENCOUNTER — Encounter: Payer: Self-pay | Admitting: Family Medicine

## 2023-05-30 LAB — ESTRADIOL: Estradiol: 218 pg/mL

## 2023-05-30 LAB — HCG, QUANTITATIVE, PREGNANCY: Quantitative HCG: <0.6 m[IU]/mL

## 2023-05-30 LAB — TSH: TSH: 0.59 u[IU]/mL (ref 0.35–5.50)

## 2023-05-30 LAB — PROLACTIN: Prolactin: 4.8 ng/mL

## 2023-05-30 LAB — FOLLICLE STIMULATING HORMONE: FSH: 6.1 m[IU]/mL

## 2023-05-31 LAB — CYTOLOGY - PAP
Chlamydia: NEGATIVE
Comment: NEGATIVE
Comment: NEGATIVE
Comment: NEGATIVE
Comment: NORMAL
Diagnosis: NEGATIVE
High risk HPV: NEGATIVE
Neisseria Gonorrhea: NEGATIVE
Trichomonas: NEGATIVE

## 2023-06-01 ENCOUNTER — Ambulatory Visit: Admitting: Family Medicine

## 2023-07-10 ENCOUNTER — Ambulatory Visit: Admitting: Family Medicine

## 2023-12-19 ENCOUNTER — Telehealth: Admitting: Physician Assistant

## 2023-12-19 DIAGNOSIS — R112 Nausea with vomiting, unspecified: Secondary | ICD-10-CM

## 2023-12-19 MED ORDER — ONDANSETRON 4 MG PO TBDP: 4.0000 mg | ORAL_TABLET | Freq: Three times a day (TID) | ORAL | 0 refills | Status: AC | PRN

## 2023-12-19 NOTE — Progress Notes (Signed)
 We are sorry that you are not feeling well. Here is how we plan to help!   Vomiting is the forceful emptying of a portion of the stomach's content through the mouth.  Although nausea and vomiting can make you feel miserable, it's important to remember that these are not diseases, but rather symptoms of an underlying illness.  When we treat short term symptoms, we always caution that any symptoms that persist should be fully evaluated in a medical office.  I have prescribed a medication that will help alleviate your symptoms and allow you to stay hydrated:  Zofran  4 mg 1 tablet every 8 hours as needed for nausea and vomiting  HOME CARE: Drink clear liquids.  This is very important! Dehydration (the lack of fluid) can lead to a serious complication.  Start off with 1 tablespoon every 5 minutes for 8 hours. You may begin eating bland foods after 8 hours without vomiting.  Start with saltine crackers, white bread, rice, mashed potatoes, applesauce. After 48 hours on a bland diet, you may resume a normal diet. Try to go to sleep.  Sleep often empties the stomach and relieves the need to vomit.  GET HELP RIGHT AWAY IF:  Your symptoms do not improve or worsen within 2 days after treatment. You have a fever for over 3 days. You cannot keep down fluids after trying the medication.  MAKE SURE YOU:  Understand these instructions. Will watch your condition. Will get help right away if you are not doing well or get worse.   Thank you for choosing an e-visit. Your e-visit answers were reviewed by a board certified advanced clinical practitioner to complete your personal care plan. Depending upon the condition, your plan could have included both over the counter or prescription medications. Please review your pharmacy choice. Be sure that the pharmacy you have chosen is open so that you can pick up your prescription now.  If there is a problem you may message your provider in MyChart to have the  prescription routed to another pharmacy. Your safety is important to us . If you have drug allergies check your prescription carefully.  For the next 24 hours, you can use MyChart to ask questions about today's visit, request a non-urgent call back, or ask for a work or school excuse from your e-visit provider. You will get an e-mail in the next two days asking about your experience. I hope that your e-visit has been valuable and will speed your recovery.  I have spent 5 minutes in review of e-visit questionnaire, review and updating patient chart, medical decision making and response to patient.   Elsie Velma Lunger, PA-C

## 2024-01-24 ENCOUNTER — Telehealth: Admitting: Physician Assistant

## 2024-01-24 DIAGNOSIS — J019 Acute sinusitis, unspecified: Secondary | ICD-10-CM

## 2024-01-24 DIAGNOSIS — B9689 Other specified bacterial agents as the cause of diseases classified elsewhere: Secondary | ICD-10-CM | POA: Diagnosis not present

## 2024-01-24 MED ORDER — AMOXICILLIN-POT CLAVULANATE 875-125 MG PO TABS: 1.0000 | ORAL_TABLET | Freq: Two times a day (BID) | ORAL | 0 refills | Status: AC

## 2024-01-24 NOTE — Progress Notes (Signed)
